# Patient Record
Sex: Male | Born: 1965 | Race: Black or African American | Hispanic: No | Marital: Single | Smoking: Never smoker
Health system: Southern US, Community
[De-identification: ages and names within clinical notes are randomized; demographics above are authoritative.]

## PROBLEM LIST (undated history)

## (undated) DIAGNOSIS — L02619 Cutaneous abscess of unspecified foot: Secondary | ICD-10-CM

---

## 2013-02-09 ENCOUNTER — Encounter (HOSPITAL_COMMUNITY): Payer: Self-pay | Admitting: Emergency Medicine

## 2013-02-09 ENCOUNTER — Emergency Department (INDEPENDENT_AMBULATORY_CARE_PROVIDER_SITE_OTHER)
Admission: EM | Admit: 2013-02-09 | Discharge: 2013-02-09 | Disposition: A | Discharge status: Nursing Home (Includes ICF) | Payer: Self-pay | Source: Home / Self Care | Attending: Family Medicine | Admitting: Family Medicine

## 2013-02-09 ENCOUNTER — Emergency Department (HOSPITAL_COMMUNITY)
Admission: EM | Admit: 2013-02-09 | Discharge: 2013-02-09 | Disposition: A | Discharge status: Home / Self Care | Payer: Self-pay | Attending: Emergency Medicine | Admitting: Emergency Medicine

## 2013-02-09 DIAGNOSIS — L0201 Cutaneous abscess of face: Secondary | ICD-10-CM

## 2013-02-09 DIAGNOSIS — L03211 Cellulitis of face: Secondary | ICD-10-CM | POA: Insufficient documentation

## 2013-02-09 MED ORDER — CEPHALEXIN 500 MG PO CAPS: 500.0000 mg | ORAL_CAPSULE | Freq: Four times a day (QID) | ORAL | Status: DC

## 2013-02-09 MED ORDER — SULFAMETHOXAZOLE-TRIMETHOPRIM 800-160 MG PO TABS: 1.0000 | ORAL_TABLET | Freq: Two times a day (BID) | ORAL | Status: DC

## 2013-02-09 NOTE — ED Notes (Signed)
Pt from [facility], c/o 2 abscesses. One to right eyebrow, one to right lower lip.

## 2013-02-09 NOTE — ED Provider Notes (Signed)
CSN: [id number]     Arrival date & time 02/09/13  1047 History  This chart was scribed for non-physician practitioner Sharilyn Sites, PA-C, working with Bonnita Levan. Bernette Mayers, MD by Dorothey Baseman, ED Scribe. This patient was seen in room TR05C/TR05C and the patient's care was started at 10:51 AM.    Chief Complaint  Patient presents with  . Abscess   The history is provided by the patient. No language interpreter was used.   HPI Comments: Devon Richardson is a 47 y.o. male who presents to the Emergency Department complaining of facial abscesses, one to the right corner of the mouth and one to the right, lateral eyebrow region onset around 1 week ago. Patient reports that he noticed 2 abscesses on his face Monday that he states appeared like pimples, but have been progressively increased in size ever since.  No active drainage.  Abscesses did not appear after shaving.  Has some intermittent itching of the area. Pt states he was on a vacation and the beach house he was staying in had spiders but he denies definitive recollection of being bitten. No new facial products, soaps, or detergents.  Patient reports applying alcohol and hot and cold compresses at home without relief. He denies fever and chills.  No visual disturbance.    No past medical history on file. No past surgical history on file. No family history on file. History  Substance Use Topics  . Smoking status: Not on file  . Smokeless tobacco: Not on file  . Alcohol Use: Not on file    Review of Systems  Constitutional: Negative for fever and chills.  Skin:       Abscess (2)  All other systems reviewed and are negative.   Allergies  Review of patient's allergies indicates no known allergies.  Home Medications  No current outpatient prescriptions on file.  Triage Vitals: BP 147/88  Pulse 105  Temp(Src) 98.3 F (36.8 C)  Resp 12  Ht 5\' 10"  (1.778 m)  Wt 170 lb (77.111 kg)  BMI 24.39 kg/m2  SpO2 100%  Physical Exam  Nursing  note and vitals reviewed. Constitutional: He is oriented to person, place, and time. He appears well-developed and well-nourished. No distress.  HENT:  Head: Normocephalic and atraumatic.    0.5cm abscess adjacent to right lower lip; no central fluctuance or active drainage; no surrounding erythema, induration, or evidence of cellulitis  Eyes: Conjunctivae and EOM are normal.    1-2cm abscess to right lateral eyebrow; no central fluctuance or drainage; localized erythema without induration or evidence of cellulitis; no visual disturbance; EOM intact and non-painful; PERRL direct and consensual  Neck: Normal range of motion.  Cardiovascular: Normal rate, regular rhythm and normal heart sounds.   Pulmonary/Chest: Effort normal and breath sounds normal. No respiratory distress.  Abdominal: Soft. He exhibits no distension.  Musculoskeletal: Normal range of motion.  Neurological: He is alert and oriented to person, place, and time.  Skin: Skin is warm and dry. He is not diaphoretic.  Psychiatric: He has a normal mood and affect.    ED Course  Procedures (including critical care time)  DIAGNOSTIC STUDIES: Oxygen Saturation is 100% on room air, normal by my interpretation.    COORDINATION OF CARE: 10:55 AM- Will consult with the attending physician and we decided not to perform an incision and drainage of the areas. Will discharge patient with Septra and Keflex and advised him to follow up in 48 hours for a wound re-check. Advised patient to  apply warm compresses to the areas. Discussed treatment plan with patient at bedside and patient verbalized agreement.     Labs Review Labs Reviewed - No data to display Imaging Review No results found.  EKG Interpretation   None       MDM   1. Facial abscess     Pt sent from [facility] for further evaluation of facial abscess and possible CT.  Evaluated by Dr. Bernette Mayers-- advised CT not indicated at this time, I&D not recommended.  Will attempt  conservative treatment with warm compresses and abx.  Wound check in 48 hours.  Discussed plan with pt, he agreed.  Return precautions advised.  I personally performed the services described in this documentation, which was scribed in my presence. The recorded information has been reviewed and is accurate.  Garlon Hatchet, PA-C 02/09/13 1150

## 2013-02-09 NOTE — ED Notes (Signed)
C/o insect bite.  Patient states he saw two pimples on his face Monday night which has grown since then.  The area has been itching.  Alcohol and hot and cold compresses was used as treatment.

## 2013-02-09 NOTE — ED Provider Notes (Signed)
CSN: [id number]     Arrival date & time 02/09/13  4098 History   First MD Initiated Contact with Patient 02/09/13 0930     Chief Complaint  Patient presents with  . Insect Bite   HPI: Patient is a 47 y.o. male presenting with abscess. The history is provided by the patient.  Abscess Pt noted what appeared to be a small pimple to his lateral (R) eyebrow Monday that has progressed in size over the last 7 days. Now w/ large, raised, approx 3cm, erythemaotous abscess. Approx a day or two later noted similar swelling to (R) lower face just to the periphery of the lip line. This area is approx 1-2  cm slightly raised and seems to track under the lip. He states these could have started as spider bites as he has noted small spiders in his home. Reports increasing pain to both. Denies fever. Pt has ayyempted to treat w/ warm compresses w/o relief.  History reviewed. No pertinent past medical history. No past surgical history on file. No family history on file. History  Substance Use Topics  . Smoking status: Not on file  . Smokeless tobacco: Not on file  . Alcohol Use: Not on file    Review of Systems  All other systems reviewed and are negative.    Allergies  Review of patient's allergies indicates no known allergies.  Home Medications  No current outpatient prescriptions on file. BP 138/83  Pulse 53  Temp(Src) 98.5 F (36.9 C) (Oral)  Resp 15  SpO2 99% Physical Exam  Constitutional: He is oriented to person, place, and time. He appears well-developed and well-nourished.  HENT:  Head: Normocephalic.    Nose: Nose normal.  Mouth/Throat: Uvula is midline, oropharynx is clear and moist and mucous membranes are normal.  Eyes: Conjunctivae and EOM are normal. Right eye exhibits no discharge. Left eye exhibits no discharge. No scleral icterus.  Cardiovascular: Normal rate.   Pulmonary/Chest: Effort normal.  Musculoskeletal: Normal range of motion.  Neurological: He is alert and  oriented to person, place, and time.  Skin: Skin is warm and dry.  Psychiatric: He has a normal mood and affect.    ED Course  Procedures (including critical care time) Labs Review Labs Reviewed - No data to display Imaging Review No results found.  EKG Interpretation     Ventricular Rate:    PR Interval:    QRS Duration:   QT Interval:    QTC Calculation:   R Axis:     Text Interpretation:              MDM  No diagnosis found.  Discussed w/ Dr Artis Flock, given potential complexity of I&D to facial abscesses and possible need for ct to better define each (particularly the abscess to the lower face area) will send to [facility] for further eval. Discussed plan with pt who is agreeable    Leanne Chang, NP 02/10/13 2125

## 2013-02-11 ENCOUNTER — Emergency Department (HOSPITAL_COMMUNITY)
Admission: EM | Admit: 2013-02-11 | Discharge: 2013-02-11 | Disposition: A | Discharge status: Home / Self Care | Payer: Self-pay | Attending: Emergency Medicine | Admitting: Emergency Medicine

## 2013-02-11 ENCOUNTER — Encounter (HOSPITAL_COMMUNITY): Payer: Self-pay | Admitting: Emergency Medicine

## 2013-02-11 DIAGNOSIS — L03211 Cellulitis of face: Secondary | ICD-10-CM | POA: Insufficient documentation

## 2013-02-11 DIAGNOSIS — L0201 Cutaneous abscess of face: Secondary | ICD-10-CM | POA: Insufficient documentation

## 2013-02-11 NOTE — ED Notes (Signed)
Pt reports he was seen here on Sunday for abscess to the right eyebrow and right corner on mouth. Reports he was placed on antibiotics, but the areas have gotten bigger. Pt reports the pain is worse and his mouth is very sore. Denies any fever at home.

## 2013-02-11 NOTE — ED Notes (Signed)
Pt screened by Ebbie Ridge, PA and ordered to move to acute side.

## 2013-02-11 NOTE — ED Provider Notes (Signed)
Medical screening examination/treatment/procedure(s) were conducted as a shared visit with non-physician practitioner(s) and myself.  I personally evaluated the patient during the encounter  Pt with folliculitis of face x 2. No abscess, no concern for deep tissue infection. Abx and strict 48hr f/u.   Charles B. Bernette Mayers, MD 02/11/13 [phone]

## 2013-02-11 NOTE — ED Provider Notes (Signed)
Medical screening examination/treatment/procedure(s) were performed by resident physician or non-physician practitioner and as supervising physician I was immediately available for consultation/collaboration.   KINDL,JAMES DOUGLAS MD.   James D Kindl, MD 02/11/13 1417 

## 2013-02-11 NOTE — ED Provider Notes (Signed)
CSN: [id number]     Arrival date & time 02/11/13  1478 History   First MD Initiated Contact with Patient 02/11/13 0902     Chief Complaint  Patient presents with  . Abscess   (Consider location/radiation/quality/duration/timing/severity/associated sxs/prior Treatment) HPI Comments: 47 year old male presents to the emergency department for worsening abscess to his right eyebrow and right lower face after being seen in the emergency department 2 days ago. Yesterday he was evaluated by urgent care and was advised to go to the emergency department for further evaluation due to the area of the abscesses are. Two days ago he was placed on Bactrim and Keflex, has been applying warm compresses, however the areas have been getting larger. Denies any vision changes, eye pain, dental pain, fever or chills. No drainage from the areas.  Patient is a 47 y.o. male presenting with abscess. The history is provided by the patient.  Abscess   History reviewed. No pertinent past medical history. History reviewed. No pertinent past surgical history. History reviewed. No pertinent family history. History  Substance Use Topics  . Smoking status: Never Smoker   . Smokeless tobacco: Not on file  . Alcohol Use: Not on file    Review of Systems  Skin:       Positive for abscess.  All other systems reviewed and are negative.    Allergies  Review of patient's allergies indicates no known allergies.  Home Medications   Current Outpatient Rx  Name  Route  Sig  Dispense  Refill  . cephALEXin (KEFLEX) 500 MG capsule   Oral   Take 1 capsule (500 mg total) by mouth 4 (four) times daily.   40 capsule   0   . sulfamethoxazole-trimethoprim (SEPTRA DS) 800-160 MG per tablet   Oral   Take 1 tablet by mouth every 12 (twelve) hours.   20 tablet   0    BP 137/90  Pulse 61  Temp(Src) 98.1 F (36.7 C) (Oral)  Resp 18  Ht 5\' 10"  (1.778 m)  Wt 172 lb (78.019 kg)  BMI 24.68 kg/m2  SpO2 100% Physical  Exam  Constitutional: He is oriented to person, place, and time. He appears well-developed and well-nourished. No distress.  HENT:  Head: Normocephalic and atraumatic.  Mouth/Throat: Oropharynx is clear and moist.  2 cm abscess with central pustule above right eyebrow, fluctuant. Tender to palpation. No drainage. No surrounding erythema or edema evidence of cellulitis. 1 cm abscess to the right of his mouth, lateral to chin with central pustule. No drainage. No surrounding erythema or edema evidence of cellulitis. Normal dentition. No abscess inside mouth. No trismus.  Eyes: Conjunctivae and EOM are normal. Pupils are equal, round, and reactive to light.  Neck: Normal range of motion. Neck supple.  Cardiovascular: Normal rate, regular rhythm and normal heart sounds.   Pulmonary/Chest: Effort normal and breath sounds normal.  Musculoskeletal: Normal range of motion. He exhibits no edema.  Neurological: He is alert and oriented to person, place, and time.  Skin: Skin is warm and dry. He is not diaphoretic.  Psychiatric: He has a normal mood and affect. His behavior is normal.    ED Course  Procedures (including critical care time) INCISION AND DRAINAGE Performed by: Johnnette Gourd Consent: Verbal consent obtained. Risks and benefits: risks, benefits and alternatives were discussed Type: abscess  Body area: above right eyebrow  Anesthesia: local infiltration  Incision was made with a scalpel.  Local anesthetic: lidocaine 2% without epinephrine  Anesthetic total: 2  ml  Complexity: complex Blunt dissection to break up loculations  Drainage: purulent  Drainage amount: large  Packing material: none  Patient tolerance: Patient tolerated the procedure well with no immediate complications.  INCISION AND DRAINAGE Performed by: Johnnette Gourd Consent: Verbal consent obtained. Risks and benefits: risks, benefits and alternatives were discussed Type: abscess  Body area:  face  Anesthesia: local infiltration  Incision was made with a scalpel.  Local anesthetic: lidocaine 2% without epinephrine  Anesthetic total: 2 ml  Complexity: simple  Drainage: purulent  Drainage amount: large  Packing material: 1/4 in iodoform gauze  Patient tolerance: Patient tolerated the procedure well with no immediate complications.        Labs Review Labs Reviewed - No data to display Imaging Review No results found.  EKG Interpretation   None       MDM   1. Abscess of face    Patient with two abscesses to his face. No eye involvement. No trismus, oral lesions. Definite focal abscesses to face. Both abscesses drained with a large amount of purulent material expressed. He is afebrile and in no apparent distress with normal vital signs. Currently taking Keflex and Bactrim, I discussed importance of continuing his antibiotics. He will followup with either the wellness clinic or urgent care in 2 days for recheck. Close return precautions discussed. Patient states understanding of treatment care plan and is agreeable. Case discussed with my attending Dr. Jeraldine Loots who also evaluated patient and agrees with plan of care.     Trevor Mace, PA-C 02/11/13 [phone]

## 2013-02-12 NOTE — ED Provider Notes (Signed)
  This was a shared visit with a mid-level provided (NP or PA).  Throughout the patient's course I was available for consultation/collaboration.  On my exam the patient stated that he was feeling substantially better.  He had already had incision and drainage of his abscesses. Patient is taking antibiotics, will return for a wound check.        Gerhard Munch, MD 02/12/13 [phone]

## 2013-03-13 ENCOUNTER — Encounter: Payer: Self-pay | Admitting: Internal Medicine

## 2013-03-13 ENCOUNTER — Ambulatory Visit: Payer: Self-pay | Attending: Internal Medicine | Admitting: Internal Medicine

## 2013-03-13 VITALS — BP 131/67 | HR 53 | Temp 98.4°F | Resp 16 | Ht 70.0 in | Wt 180.0 lb

## 2013-03-13 DIAGNOSIS — L03211 Cellulitis of face: Secondary | ICD-10-CM | POA: Insufficient documentation

## 2013-03-13 DIAGNOSIS — L0201 Cutaneous abscess of face: Secondary | ICD-10-CM | POA: Insufficient documentation

## 2013-03-13 DIAGNOSIS — Z0000 Encounter for general adult medical examination without abnormal findings: Secondary | ICD-10-CM

## 2013-03-13 NOTE — Progress Notes (Signed)
Pt here to establish care Urgent care f/u right upper lid abscess with ATB treatment. No swelling or blurry vision noted No medical hx noted

## 2013-03-13 NOTE — Progress Notes (Signed)
Patient Demographics  Devon Richardson, is a 47 y.o. male  ZOX:[id number]  [id number]:[id number]  DOB - Dec 21, 1965  Chief Complaint  Patient presents with  . Follow-up  . Establish Care        Subjective:   Devon Richardson today is here to establish primary care. Patient is a 47 year old African American male without any past medical history wheezing he had some small facial abscess that was incised and drained, he has completed a course of antibiotics. These abscesses have completely resolved. He is yet to establish care.  Currently patient has no complaints. Patient has also has No headache, No chest pain, No abdominal pain,No Nausea, No new weakness tingling or numbness, No Cough or SOB.  Objective:    Filed Vitals:   03/13/13 1429  BP: 131/67  Pulse: 53  Temp: 98.4 F (36.9 C)  TempSrc: Oral  Resp: 16  Height: 5\' 10"  (1.778 m)  Weight: 180 lb (81.647 kg)  SpO2: 99%     ALLERGIES:  No Known Allergies  PAST MEDICAL HISTORY: History reviewed. No pertinent past medical history.  PAST SURGICAL HISTORY: History reviewed. No pertinent past surgical history.  FAMILY HISTORY: History reviewed. No pertinent family history.  MEDICATIONS AT HOME: Prior to Admission medications   Medication Sig Start Date End Date Taking? Authorizing Provider  cephALEXin (KEFLEX) 500 MG capsule Take 1 capsule (500 mg total) by mouth 4 (four) times daily. 02/09/13   Garlon Hatchet, PA-C  sulfamethoxazole-trimethoprim (SEPTRA DS) 800-160 MG per tablet Take 1 tablet by mouth every 12 (twelve) hours. 02/09/13   Garlon Hatchet, PA-C    SOCIAL HISTORY:   reports that he has never smoked. He does not have any smokeless tobacco history on file. His alcohol and drug histories are not on file.  REVIEW OF SYSTEMS:  Constitutional:   No   Fevers, chills, fatigue.  HEENT:    No headaches, Sore throat,   Cardio-vascular: No chest pain,  Orthopnea, swelling in lower extremities, anasarca,  palpitations  GI:  No abdominal pain, nausea, vomiting, diarrhea  Resp: No shortness of breath,  No coughing up of blood.No cough.No wheezing.  Skin:  no rash or lesions.  GU:  no dysuria, change in color of urine, no urgency or frequency.  No flank pain.  Musculoskeletal: No joint pain or swelling.  No decreased range of motion.  No back pain.  Psych: No change in mood or affect. No depression or anxiety.  No memory loss.   Exam  General appearance :Awake, alert, not in any distress. Speech Clear. Not toxic Looking HEENT: Atraumatic and Normocephalic, pupils equally reactive to light and accomodation Neck: supple, no JVD. No cervical lymphadenopathy.  Chest:Good air entry bilaterally, no added sounds  CVS: S1 S2 regular, no murmurs.  Abdomen: Bowel sounds present, Non tender and not distended with no gaurding, rigidity or rebound. Extremities: B/L Lower Ext shows no edema, both legs are warm to touch Neurology: Awake alert, and oriented X 3, CN II-XII intact, Non focal Skin:No Rash Wounds:N/A    Data Review   CBC No results found for this basename: WBC, HGB, HCT, PLT, MCV, MCH, MCHC, RDW, NEUTRABS, LYMPHSABS, MONOABS, EOSABS, BASOSABS, BANDABS, BANDSABD,  in the last 168 hours  Chemistries   No results found for this basename: NA, K, CL, CO2, GLUCOSE, BUN, CREATININE, GFRCGP, CALCIUM, MG, AST, ALT, ALKPHOS, BILITOT,  in the last 168 hours ------------------------------------------------------------------------------------------------------------------ No results found for this basename: HGBA1C,  in the last 72 hours ------------------------------------------------------------------------------------------------------------------ No  results found for this basename: CHOL, HDL, LDLCALC, TRIG, CHOLHDL, LDLDIRECT,  in the last 72 hours ------------------------------------------------------------------------------------------------------------------ No results found for  this basename: TSH, T4TOTAL, FREET3, T3FREE, THYROIDAB,  in the last 72 hours ------------------------------------------------------------------------------------------------------------------ No results found for this basename: VITAMINB12, FOLATE, FERRITIN, TIBC, IRON, RETICCTPCT,  in the last 72 hours  Coagulation profile  No results found for this basename: INR, PROTIME,  in the last 168 hours    Assessment & Plan   Small facial abscess-resolved  Will check CBC, TSH, chemistries, lipids and A1c  Followup in 3-4 weeks to discuss above results  The patient was given clear instructions to go to ER or return to medical center if symptoms don't improve, worsen or new problems develop. The patient verbalized understanding. The patient was told to call to get lab results if they haven't heard anything in the next week.

## 2013-03-20 ENCOUNTER — Ambulatory Visit: Payer: Self-pay | Attending: Internal Medicine

## 2013-03-20 DIAGNOSIS — Z0000 Encounter for general adult medical examination without abnormal findings: Secondary | ICD-10-CM

## 2013-03-20 LAB — LIPID PANEL
HDL: 73 mg/dL (ref >39)
LDL Cholesterol: 81 mg/dL (ref 0–99)
Total CHOL/HDL Ratio: 2.2 Ratio
Triglycerides: 28 mg/dL (ref <150)
VLDL: 6 mg/dL (ref 0–40)

## 2013-03-20 LAB — HEMOGLOBIN A1C: Mean Plasma Glucose: 114 mg/dL (ref <117)

## 2013-03-20 LAB — CBC
HCT: 39.8 % (ref 39.0–52.0)
MCH: 28.7 pg (ref 26.0–34.0)
MCHC: 34.2 g/dL (ref 30.0–36.0)
Platelets: 194 10*3/uL (ref 150–400)
RDW: 13.3 % (ref 11.5–15.5)
WBC: 3.6 10*3/uL — ABNORMAL LOW (ref 4.0–10.5)

## 2013-03-31 DIAGNOSIS — L02619 Cutaneous abscess of unspecified foot: Secondary | ICD-10-CM

## 2013-03-31 HISTORY — DX: Cutaneous abscess of unspecified foot: L02.619

## 2013-04-03 ENCOUNTER — Ambulatory Visit: Payer: Self-pay

## 2013-04-04 ENCOUNTER — Emergency Department (HOSPITAL_COMMUNITY)
Admission: EM | Admit: 2013-04-04 | Discharge: 2013-04-04 | Disposition: A | Discharge status: Home / Self Care | Payer: Self-pay | Attending: Emergency Medicine | Admitting: Emergency Medicine

## 2013-04-04 ENCOUNTER — Emergency Department (HOSPITAL_COMMUNITY): Payer: Self-pay

## 2013-04-04 ENCOUNTER — Encounter (HOSPITAL_COMMUNITY): Payer: Self-pay | Admitting: Emergency Medicine

## 2013-04-04 DIAGNOSIS — Y929 Unspecified place or not applicable: Secondary | ICD-10-CM | POA: Insufficient documentation

## 2013-04-04 DIAGNOSIS — Y9389 Activity, other specified: Secondary | ICD-10-CM | POA: Insufficient documentation

## 2013-04-04 DIAGNOSIS — R21 Rash and other nonspecific skin eruption: Secondary | ICD-10-CM | POA: Insufficient documentation

## 2013-04-04 DIAGNOSIS — S8990XA Unspecified injury of unspecified lower leg, initial encounter: Secondary | ICD-10-CM | POA: Insufficient documentation

## 2013-04-04 DIAGNOSIS — IMO0002 Reserved for concepts with insufficient information to code with codable children: Secondary | ICD-10-CM | POA: Insufficient documentation

## 2013-04-04 DIAGNOSIS — M79671 Pain in right foot: Secondary | ICD-10-CM

## 2013-04-04 MED ORDER — HYDROCODONE-ACETAMINOPHEN 5-325 MG PO TABS: 1.0000 | ORAL_TABLET | Freq: Four times a day (QID) | ORAL | Status: DC | PRN

## 2013-04-04 MED ORDER — IBUPROFEN 800 MG PO TABS: 800.0000 mg | ORAL_TABLET | Freq: Three times a day (TID) | ORAL | Status: DC

## 2013-04-04 MED ORDER — HYDROCODONE-ACETAMINOPHEN 5-325 MG PO TABS
1.0000 | ORAL_TABLET | Freq: Once | ORAL | Status: AC
  Administered 2013-04-04: 1 via ORAL
  Filled 2013-04-04: qty 1

## 2013-04-04 NOTE — ED Provider Notes (Signed)
CSN: [id number]     Arrival date & time 04/04/13  1830 History   First MD Initiated Contact with Patient 04/04/13 1900     This chart was scribed for non-physician practitioner, Francee Piccolo PA-C working with Layla Maw Ward, DO by Arlan Organ, ED Scribe. This patient was seen in room TR09C/TR09C and the patient's care was started at 7:50 PM.   Chief Complaint  Patient presents with  . Foot Swelling   The history is provided by the patient. No language interpreter was used.    HPI Comments: Devon Richardson is a 47 y.o. male who presents to the Emergency Department complaining of gradual onset, unchanged, constant right foot swelling that started 2 days ago. Pt states he hit his foot on a cinder block while wearing a steel toe shoe. Pt says he noted swelling and discomfort the following morning. He describes the pain as "throbbing" and "stabbing". He states elevation worsens the pain, and standing up makes the pain better. He says he has tried Aleve and Tylenol with no relief. He denies any prior injury to the foot. He denies cough or emesis. Pt has a h/o an ongoing intermittent fungal infection to the right foot.  History reviewed. No pertinent past medical history. History reviewed. No pertinent past surgical history. History reviewed. No pertinent family history. History  Substance Use Topics  . Smoking status: Never Smoker   . Smokeless tobacco: Not on file  . Alcohol Use: Yes    Review of Systems  Respiratory: Negative for cough.   Gastrointestinal: Negative for vomiting.  Musculoskeletal: Positive for arthralgias (right ankle swelling).    Allergies  Review of patient's allergies indicates no known allergies.  Home Medications   Current Outpatient Rx  Name  Route  Sig  Dispense  Refill  . ibuprofen (ADVIL,MOTRIN) 200 MG tablet   Oral   Take 400 mg by mouth 2 (two) times daily as needed (pain).         . naproxen sodium (ANAPROX) 220 MG tablet   Oral   Take 440  mg by mouth every 4 (four) hours as needed (pain).         Marland Kitchen HYDROcodone-acetaminophen (NORCO/VICODIN) 5-325 MG per tablet   Oral   Take 1 tablet by mouth every 6 (six) hours as needed for severe pain.   8 tablet   0   . ibuprofen (ADVIL,MOTRIN) 800 MG tablet   Oral   Take 1 tablet (800 mg total) by mouth 3 (three) times daily.   21 tablet   0    Triage Vitals: BP 144/78  Pulse 70  Temp(Src) 98.2 F (36.8 C) (Oral)  Ht 5\' 11"  (1.803 m)  Wt 185 lb 9.6 oz (84.188 kg)  BMI 25.90 kg/m2  SpO2 98%  Physical Exam  Nursing note and vitals reviewed. Constitutional: He is oriented to person, place, and time. He appears well-developed and well-nourished. No distress.  HENT:  Head: Normocephalic and atraumatic.  Right Ear: External ear normal.  Left Ear: External ear normal.  Nose: Nose normal.  Eyes: Conjunctivae and EOM are normal.  Neck: Normal range of motion.  Cardiovascular: Normal rate and intact distal pulses.   Cap refill < 3 sec  Pulmonary/Chest: Effort normal.  Musculoskeletal: Normal range of motion. He exhibits tenderness.       Right ankle: Normal.       Left ankle: Normal.       Right foot: He exhibits tenderness and swelling. He exhibits no bony  tenderness, normal capillary refill, no crepitus, no deformity and no laceration.       Left foot: Normal.  Neurological: He is alert and oriented to person, place, and time.  Skin: Skin is warm and dry. Rash (Scaly rash to right and left foot) noted. He is not diaphoretic.  Psychiatric: He has a normal mood and affect. His behavior is normal.    ED Course  Procedures (including critical care time)  DIAGNOSTIC STUDIES: Oxygen Saturation is 98% on RA, Normal by my interpretation.    COORDINATION OF CARE: 7:53 PM- Will order X-Ray. Will give pain medication. Discussed treatment plan with pt at bedside and pt agreed to plan.     Labs Review Labs Reviewed - No data to display Imaging Review Dg Foot Complete  Right  04/04/2013   CLINICAL DATA:  Right foot pain in the region of the 2nd MTP joint following an injury.  EXAM: RIGHT FOOT COMPLETE - 3+ VIEW  COMPARISON:  None.  FINDINGS: Distal soft tissue swelling. No fracture or dislocation. Accessory ossification centers adjacent to the navicular.  IMPRESSION: No fracture.   Electronically Signed   By: Gordan Payment M.D.   On: 04/04/2013 19:13    EKG Interpretation   None       MDM   1. Right foot pain     Afebrile, NAD, non-toxic appearing, AAOx4. Neurovascularly intact. Normal sensation. Pt w/ non acute tinea pedis, uses Lotrimin at home intermittently with success. Patient X-Ray negative for obvious fracture or dislocation. Pain managed in ED. Pt advised to follow up with orthopedics if symptoms persist for possibility of missed fracture diagnosis. Patient given brace while in ED, conservative therapy recommended and discussed. Patient will be dc home & is agreeable with above plan. Return precautions discussed. Patient is agreeable to plan. Patient is stable at time of discharge.     I personally performed the services described in this documentation, which was scribed in my presence. The recorded information has been reviewed and is accurate.    Jeannetta Ellis, PA-C 04/04/13 2216

## 2013-04-04 NOTE — Progress Notes (Signed)
Orthopedic Tech Progress Note Patient Details:  Devon Richardson 11-Mar-1966 [id number]  Ortho Devices Type of Ortho Device: Postop shoe/boot Ortho Device/Splint Location: RLE Ortho Device/Splint Interventions: Ordered;Application   Jennye Moccasin 04/04/2013, 8:08 PM

## 2013-04-04 NOTE — ED Provider Notes (Signed)
Medical screening examination/treatment/procedure(s) were performed by non-physician practitioner and as supervising physician I was immediately available for consultation/collaboration.  EKG Interpretation   None         Vanity Larsson N Esmerelda Finnigan, DO 04/04/13 2357 

## 2013-04-04 NOTE — ED Notes (Signed)
Pt states he hit his foot on Saturday on a cinderblock while wearing a steel toe shoe. The next morning his foot started swelling was painful. Foot appears warm and swollen. 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 Strong pedal pulses present in foot.

## 2013-04-06 ENCOUNTER — Encounter (HOSPITAL_COMMUNITY): Payer: Self-pay | Admitting: Emergency Medicine

## 2013-04-06 ENCOUNTER — Emergency Department (HOSPITAL_COMMUNITY)
Admission: EM | Admit: 2013-04-06 | Discharge: 2013-04-07 | Disposition: A | Discharge status: Home / Self Care | Payer: Self-pay | Attending: Emergency Medicine | Admitting: Emergency Medicine

## 2013-04-06 DIAGNOSIS — X58XXXA Exposure to other specified factors, initial encounter: Secondary | ICD-10-CM | POA: Insufficient documentation

## 2013-04-06 DIAGNOSIS — Y939 Activity, unspecified: Secondary | ICD-10-CM | POA: Insufficient documentation

## 2013-04-06 DIAGNOSIS — L02619 Cutaneous abscess of unspecified foot: Secondary | ICD-10-CM | POA: Insufficient documentation

## 2013-04-06 DIAGNOSIS — L089 Local infection of the skin and subcutaneous tissue, unspecified: Secondary | ICD-10-CM

## 2013-04-06 DIAGNOSIS — Y929 Unspecified place or not applicable: Secondary | ICD-10-CM | POA: Insufficient documentation

## 2013-04-06 LAB — CBC WITH DIFFERENTIAL/PLATELET
Basophils Absolute: 0 10*3/uL (ref 0.0–0.1)
Basophils Relative: 0 % (ref 0–1)
Eosinophils Absolute: 0.2 10*3/uL (ref 0.0–0.7)
Eosinophils Relative: 1 % (ref 0–5)
HCT: 39.2 % (ref 39.0–52.0)
MCH: 30.1 pg (ref 26.0–34.0)
MCHC: 34.4 g/dL (ref 30.0–36.0)
MCV: 87.5 fL (ref 78.0–100.0)
Monocytes Absolute: 0.8 10*3/uL (ref 0.1–1.0)
Monocytes Relative: 8 % (ref 3–12)
Neutro Abs: 8.3 10*3/uL — ABNORMAL HIGH (ref 1.7–7.7)
Platelets: 252 10*3/uL (ref 150–400)
RDW: 13.2 % (ref 11.5–15.5)
WBC: 11.1 10*3/uL — ABNORMAL HIGH (ref 4.0–10.5)

## 2013-04-06 NOTE — ED Notes (Signed)
Pt presents here with swollen right foot, draining yellow fluid between two toes. Denies N/V/D. Was seen here Friday for foot pain but foot has progressively worsened.

## 2013-04-06 NOTE — ED Provider Notes (Signed)
MSE was initiated and I personally evaluated the patient and placed orders  at  11:27 PM on April 06, 2013.   Devon Richardson is a 47 y.o. male who presents to the Emergency Department complaining of constant, worsening foot pain and swelling that began a week ago. He states he hit his foot on a cinder block  but the pain and swelling began a few days later. He was seen on 12/5 in the ED for the same pain but was discharged with pain medication after the x-ray came back normal. He returned to the ED because the area has started draining, his foot is significant more swollen. He states the area has been infected in the past a few years ago.  PE: The foot is significantly swollen. He has sausage digits with a significant amount of purulent bloody discharge from the toes. He is in significant pain and is unable to walk on it.  Labs: Cbc and bmp ordered.   The patient appears stable so that the remainder of the MSE may be completed by another provider.   Dorthula Matas, PA-C 04/06/13 2329  Dorthula Matas, PA-C 04/06/13 2330

## 2013-04-07 ENCOUNTER — Emergency Department (HOSPITAL_COMMUNITY): Payer: Self-pay

## 2013-04-07 LAB — BASIC METABOLIC PANEL
BUN: 14 mg/dL (ref 6–23)
Calcium: 9.5 mg/dL (ref 8.4–10.5)
Chloride: 100 mEq/L (ref 96–112)
Creatinine, Ser: 1.03 mg/dL (ref 0.50–1.35)
GFR calc Af Amer: >90 mL/min (ref >90)
GFR calc non Af Amer: 85 mL/min — ABNORMAL LOW (ref >90)
Sodium: 136 mEq/L (ref 135–145)

## 2013-04-07 LAB — SEDIMENTATION RATE: Sed Rate: 31 mm/hr — ABNORMAL HIGH (ref 0–16)

## 2013-04-07 MED ORDER — OXYCODONE-ACETAMINOPHEN 5-325 MG PO TABS: 1.0000 | ORAL_TABLET | ORAL | Status: DC | PRN

## 2013-04-07 MED ORDER — CIPROFLOXACIN HCL 500 MG PO TABS: 500.0000 mg | ORAL_TABLET | Freq: Two times a day (BID) | ORAL | Status: DC

## 2013-04-07 MED ORDER — CLINDAMYCIN PHOSPHATE 600 MG/50ML IV SOLN
600.0000 mg | Freq: Once | INTRAVENOUS | Status: AC
  Administered 2013-04-07: 600 mg via INTRAVENOUS
  Filled 2013-04-07: qty 50

## 2013-04-07 NOTE — ED Notes (Signed)
Dr molpus at bedside  

## 2013-04-07 NOTE — ED Provider Notes (Signed)
Medical screening examination/treatment/procedure(s) were conducted as a shared visit with non-physician practitioner(s) and myself.  I personally evaluated the patient during the encounter.  EKG Interpretation   None      Swollen, tender R 2nd toe with purulent drainage.   Hanley Seamen, MD 04/07/13 1905

## 2013-04-07 NOTE — ED Notes (Signed)
Drainage cultures done prior to abx administration

## 2013-04-07 NOTE — ED Provider Notes (Signed)
CSN: [id number]     Arrival date & time 04/06/13  2243 History   First MD Initiated Contact with Patient 04/06/13 2307     Chief Complaint  Patient presents with  . Foot Pain   (Consider location/radiation/quality/duration/timing/severity/associated sxs/prior Treatment) HPI History provided by pt and prior chart.  Per prior chart, pt presented to ED on 04/04/13 w/ c/o R foot pain, s/p mechanical injury that same day.  Xray negative for fx/dislocation at that time and d/c'd home w/ post-op shoe and vicodin.  Reports that edema and pain have worsened despite elevation, icing and NSAID.  There is now purulent drainage from second toe.  No associated f/c, fatigue, generalized weakness, N/V/D, paresthesias.  No pertinent PMH. History reviewed. No pertinent past medical history. History reviewed. No pertinent past surgical history. No family history on file. History  Substance Use Topics  . Smoking status: Never Smoker   . Smokeless tobacco: Not on file  . Alcohol Use: Yes    Review of Systems  All other systems reviewed and are negative.    Allergies  Review of patient's allergies indicates no known allergies.  Home Medications   Current Outpatient Rx  Name  Route  Sig  Dispense  Refill  . HYDROcodone-acetaminophen (NORCO/VICODIN) 5-325 MG per tablet   Oral   Take 1 tablet by mouth every 6 (six) hours as needed for severe pain.   8 tablet   0   . ibuprofen (ADVIL,MOTRIN) 800 MG tablet   Oral   Take 1 tablet (800 mg total) by mouth 3 (three) times daily.   21 tablet   0   . naproxen sodium (ANAPROX) 220 MG tablet   Oral   Take 440 mg by mouth every 4 (four) hours as needed (pain).          BP 146/85  Pulse 78  Temp(Src) 98.1 F (36.7 C) (Oral)  Resp 18  SpO2 100% Physical Exam  Nursing note and vitals reviewed. Constitutional: He is oriented to person, place, and time. He appears well-developed and well-nourished. No distress.  HENT:  Head: Normocephalic and  atraumatic.  Eyes:  Normal appearance  Neck: Normal range of motion.  Pulmonary/Chest: Effort normal.  Musculoskeletal: Normal range of motion.  Right foot diffuse edematous.  Mild, poorly defined erythema of dorsal surface of forefoot. Macerated skin between all toes.  Drainage of purulent fluid from lateral aspect of distal phalanx of second toe.  Entire toe ttp. Distal sensation intact.  2+ DP pulse.    Neurological: He is alert and oriented to person, place, and time.  Psychiatric: He has a normal mood and affect. His behavior is normal.    ED Course  Procedures (including critical care time) Labs Review Labs Reviewed  CBC WITH DIFFERENTIAL - Abnormal; Notable for the following:    WBC 11.1 (*)    Neutro Abs 8.3 (*)    All other components within normal limits  BASIC METABOLIC PANEL   Imaging Review Dg Foot Complete Right  04/07/2013   CLINICAL DATA:  Injury to right second toe, with worsening swelling and laceration. Soft tissue infection; concern for osteomyelitis.  EXAM: RIGHT FOOT COMPLETE - 3+ VIEW  COMPARISON:  Right foot radiographs performed 04/04/2013  FINDINGS: There is significantly increased soft tissue swelling about the proximal second toe. There is no evidence of osseous erosion at this time to suggest osteomyelitis. There is no evidence of fracture or dislocation. Visualized joint spaces are grossly preserved.  Dorsal soft tissue swelling  is also noted at the forefoot. No radiopaque foreign bodies are seen.  IMPRESSION: No evidence of osseous erosion to suggest osteomyelitis. Significantly increased soft tissue swelling noted about the proximal second toe.   Electronically Signed   By: Roanna Raider M.D.   On: 04/07/2013 01:02    EKG Interpretation   None       MDM   1. Foot infection    Healthy 47yo M seen in ED for R foot injury 2 days ago, xray obtained and was neg for fx/dislocation.  Returns for gradually worsening pain/edema as well as new onset drainage  from L toe.  No systemic sx.  On exam, afebrile, NAD, diffusely edematous R foot, tenderness and drainage of purulent fluid from 2nd toe, no NV deficits.  Repeat xray ordered to look for evidence of Osteomyelitis.  Labs pending as well.  Pt to receive 600mg  IV clindamycin.  12:09 AM   Sed rate mildly elevated at 31.  No leukocytosis.  Xray negative for Osteomyelitis.  Case discussed w/ Dr. Luiz Blare who recommends discharge w/ cipro and f/u with him in office Tuesday.  Patient understands the importance of compliance with abx as well as f/u.  Return precautions discussed.     Otilio Miu, PA-C 04/07/13 1433

## 2013-04-08 ENCOUNTER — Other Ambulatory Visit (HOSPITAL_COMMUNITY): Payer: Self-pay | Admitting: Orthopaedic Surgery

## 2013-04-08 ENCOUNTER — Encounter (HOSPITAL_COMMUNITY): Payer: Self-pay | Admitting: *Deleted

## 2013-04-08 ENCOUNTER — Encounter (HOSPITAL_COMMUNITY): Payer: Self-pay | Admitting: Anesthesiology

## 2013-04-08 ENCOUNTER — Inpatient Hospital Stay (HOSPITAL_COMMUNITY): Payer: Self-pay | Admitting: Anesthesiology

## 2013-04-08 ENCOUNTER — Encounter (HOSPITAL_COMMUNITY)
Admission: AD | Disposition: A | Discharge status: Home / Self Care | Payer: Self-pay | Source: Ambulatory Visit | Attending: Orthopaedic Surgery

## 2013-04-08 ENCOUNTER — Inpatient Hospital Stay (HOSPITAL_COMMUNITY)
Admission: AD | Admit: 2013-04-08 | Discharge: 2013-04-11 | DRG: 572 | Disposition: A | Discharge status: Home Health | Payer: Self-pay | Source: Ambulatory Visit | Attending: Orthopaedic Surgery | Admitting: Orthopaedic Surgery

## 2013-04-08 DIAGNOSIS — L03039 Cellulitis of unspecified toe: Principal | ICD-10-CM | POA: Diagnosis present

## 2013-04-08 DIAGNOSIS — L089 Local infection of the skin and subcutaneous tissue, unspecified: Secondary | ICD-10-CM

## 2013-04-08 DIAGNOSIS — L02619 Cutaneous abscess of unspecified foot: Principal | ICD-10-CM | POA: Diagnosis present

## 2013-04-08 HISTORY — PX: DEBRIDEMENT TOE: SUR397

## 2013-04-08 HISTORY — DX: Cutaneous abscess of unspecified foot: L02.619

## 2013-04-08 HISTORY — PX: I & D EXTREMITY: SHX5045

## 2013-04-08 LAB — CBC WITH DIFFERENTIAL/PLATELET
Basophils Relative: 0 % (ref 0–1)
HCT: 41.5 % (ref 39.0–52.0)
Hemoglobin: 14.1 g/dL (ref 13.0–17.0)
Lymphocytes Relative: 15 % (ref 12–46)
MCHC: 34 g/dL (ref 30.0–36.0)
Monocytes Absolute: 0.6 10*3/uL (ref 0.1–1.0)
Monocytes Relative: 7 % (ref 3–12)
Neutro Abs: 7 10*3/uL (ref 1.7–7.7)
Neutrophils Relative %: 77 % (ref 43–77)
RBC: 4.77 MIL/uL (ref 4.22–5.81)
WBC: 9.1 10*3/uL (ref 4.0–10.5)

## 2013-04-08 LAB — BASIC METABOLIC PANEL
BUN: 13 mg/dL (ref 6–23)
Calcium: 9.3 mg/dL (ref 8.4–10.5)
Chloride: 97 mEq/L (ref 96–112)
GFR calc Af Amer: >90 mL/min (ref >90)
GFR calc non Af Amer: 79 mL/min — ABNORMAL LOW (ref >90)
Potassium: 3.8 mEq/L (ref 3.5–5.1)
Sodium: 135 mEq/L (ref 135–145)

## 2013-04-08 LAB — C-REACTIVE PROTEIN: CRP: 5.4 mg/dL — ABNORMAL HIGH (ref <0.60)

## 2013-04-08 SURGERY — IRRIGATION AND DEBRIDEMENT EXTREMITY
Anesthesia: General | Site: Toe | Laterality: Right

## 2013-04-08 MED ORDER — MAGNESIUM CITRATE PO SOLN: 1.0000 | Freq: Once | ORAL | Status: AC | PRN

## 2013-04-08 MED ORDER — ONDANSETRON HCL 4 MG/2ML IJ SOLN
INTRAMUSCULAR | Status: DC | PRN
  Administered 2013-04-08: 4 mg via INTRAVENOUS

## 2013-04-08 MED ORDER — LACTATED RINGERS IV SOLN
INTRAVENOUS | Status: DC
  Administered 2013-04-08: 11:00:00 via INTRAVENOUS

## 2013-04-08 MED ORDER — HYDROMORPHONE HCL PF 1 MG/ML IJ SOLN: 0.2500 mg | INTRAMUSCULAR | Status: DC | PRN

## 2013-04-08 MED ORDER — VANCOMYCIN HCL 1000 MG IV SOLR
1000.0000 mg | Freq: Two times a day (BID) | INTRAVENOUS | Status: DC
  Administered 2013-04-08 (×2): 1000 mg via INTRAVENOUS
  Filled 2013-04-08 (×3): qty 1000

## 2013-04-08 MED ORDER — DIPHENHYDRAMINE HCL 50 MG/ML IJ SOLN
INTRAMUSCULAR | Status: DC | PRN
  Administered 2013-04-08: 12.5 mg via INTRAVENOUS

## 2013-04-08 MED ORDER — OXYCODONE HCL 5 MG/5ML PO SOLN: 5.0000 mg | Freq: Once | ORAL | Status: DC | PRN

## 2013-04-08 MED ORDER — LIDOCAINE HCL (CARDIAC) 10 MG/ML IV SOLN
INTRAVENOUS | Status: DC | PRN
  Administered 2013-04-08: 100 mg via INTRAVENOUS

## 2013-04-08 MED ORDER — MIDAZOLAM HCL 5 MG/5ML IJ SOLN
INTRAMUSCULAR | Status: DC | PRN
  Administered 2013-04-08: 2 mg via INTRAVENOUS

## 2013-04-08 MED ORDER — ONDANSETRON HCL 4 MG/2ML IJ SOLN: 4.0000 mg | Freq: Once | INTRAMUSCULAR | Status: DC | PRN

## 2013-04-08 MED ORDER — SODIUM CHLORIDE 0.9 % IR SOLN
Status: DC | PRN
  Administered 2013-04-08: 1000 mL

## 2013-04-08 MED ORDER — ONDANSETRON HCL 4 MG PO TABS: 4.0000 mg | ORAL_TABLET | Freq: Four times a day (QID) | ORAL | Status: DC | PRN

## 2013-04-08 MED ORDER — METOCLOPRAMIDE HCL 5 MG/ML IJ SOLN: 5.0000 mg | Freq: Three times a day (TID) | INTRAMUSCULAR | Status: DC | PRN

## 2013-04-08 MED ORDER — HYDROCODONE-ACETAMINOPHEN 5-325 MG PO TABS
1.0000 | ORAL_TABLET | ORAL | Status: DC | PRN
  Administered 2013-04-10: 2 via ORAL
  Filled 2013-04-08: qty 2

## 2013-04-08 MED ORDER — POLYETHYLENE GLYCOL 3350 17 G PO PACK: 17.0000 g | PACK | Freq: Every day | ORAL | Status: DC | PRN

## 2013-04-08 MED ORDER — SENNA 8.6 MG PO TABS
1.0000 | ORAL_TABLET | Freq: Two times a day (BID) | ORAL | Status: DC
  Administered 2013-04-08 – 2013-04-10 (×6): 8.6 mg via ORAL
  Filled 2013-04-08 (×8): qty 1

## 2013-04-08 MED ORDER — VANCOMYCIN HCL IN DEXTROSE 1-5 GM/200ML-% IV SOLN
INTRAVENOUS | Status: AC
  Filled 2013-04-08: qty 200

## 2013-04-08 MED ORDER — OXYCODONE HCL 5 MG PO TABS: 5.0000 mg | ORAL_TABLET | Freq: Once | ORAL | Status: DC | PRN

## 2013-04-08 MED ORDER — DIPHENHYDRAMINE HCL 12.5 MG/5ML PO ELIX: 25.0000 mg | ORAL_SOLUTION | ORAL | Status: DC | PRN

## 2013-04-08 MED ORDER — SODIUM CHLORIDE 0.9 % IV SOLN
INTRAVENOUS | Status: DC
  Administered 2013-04-09: 1000 mL via INTRAVENOUS

## 2013-04-08 MED ORDER — ONDANSETRON HCL 4 MG/2ML IJ SOLN: 4.0000 mg | Freq: Four times a day (QID) | INTRAMUSCULAR | Status: DC | PRN

## 2013-04-08 MED ORDER — METHOCARBAMOL 500 MG PO TABS: 500.0000 mg | ORAL_TABLET | Freq: Four times a day (QID) | ORAL | Status: DC | PRN

## 2013-04-08 MED ORDER — OXYCODONE HCL 5 MG PO TABS
5.0000 mg | ORAL_TABLET | ORAL | Status: DC | PRN
  Administered 2013-04-09 – 2013-04-10 (×8): 10 mg via ORAL
  Filled 2013-04-08 (×8): qty 2

## 2013-04-08 MED ORDER — LACTATED RINGERS IV SOLN
INTRAVENOUS | Status: DC | PRN
  Administered 2013-04-08: 11:00:00 via INTRAVENOUS

## 2013-04-08 MED ORDER — SORBITOL 70 % SOLN: 30.0000 mL | Freq: Every day | Status: DC | PRN

## 2013-04-08 MED ORDER — MORPHINE SULFATE 2 MG/ML IJ SOLN: 1.0000 mg | INTRAMUSCULAR | Status: DC | PRN

## 2013-04-08 MED ORDER — METOCLOPRAMIDE HCL 5 MG PO TABS
5.0000 mg | ORAL_TABLET | Freq: Three times a day (TID) | ORAL | Status: DC | PRN
  Filled 2013-04-08: qty 2

## 2013-04-08 MED ORDER — PROPOFOL 10 MG/ML IV BOLUS
INTRAVENOUS | Status: DC | PRN
  Administered 2013-04-08: 200 mg via INTRAVENOUS

## 2013-04-08 MED ORDER — METHOCARBAMOL 100 MG/ML IJ SOLN
500.0000 mg | Freq: Four times a day (QID) | INTRAVENOUS | Status: DC | PRN
  Filled 2013-04-08: qty 5

## 2013-04-08 MED ORDER — FENTANYL CITRATE 0.05 MG/ML IJ SOLN
INTRAMUSCULAR | Status: DC | PRN
  Administered 2013-04-08: 50 ug via INTRAVENOUS
  Administered 2013-04-08 (×2): 100 ug via INTRAVENOUS

## 2013-04-08 SURGICAL SUPPLY — 61 items
BANDAGE CONFORM 3  STR LF (GAUZE/BANDAGES/DRESSINGS) IMPLANT
BANDAGE ELASTIC 3 VELCRO ST LF (GAUZE/BANDAGES/DRESSINGS) IMPLANT
BANDAGE GAUZE ELAST BULKY 4 IN (GAUZE/BANDAGES/DRESSINGS) ×2 IMPLANT
BLADE SURG 10 STRL SS (BLADE) ×2 IMPLANT
BNDG COHESIVE 1X5 TAN STRL LF (GAUZE/BANDAGES/DRESSINGS) IMPLANT
BNDG COHESIVE 4X5 TAN STRL (GAUZE/BANDAGES/DRESSINGS) ×2 IMPLANT
BNDG COHESIVE 6X5 TAN STRL LF (GAUZE/BANDAGES/DRESSINGS) ×4 IMPLANT
BNDG GAUZE STRTCH 6 (GAUZE/BANDAGES/DRESSINGS) ×6 IMPLANT
CLOTH BEACON ORANGE TIMEOUT ST (SAFETY) ×2 IMPLANT
CORDS BIPOLAR (ELECTRODE) IMPLANT
COVER SURGICAL LIGHT HANDLE (MISCELLANEOUS) ×2 IMPLANT
CUFF TOURNIQUET SINGLE 18IN (TOURNIQUET CUFF) ×2 IMPLANT
CUFF TOURNIQUET SINGLE 24IN (TOURNIQUET CUFF) IMPLANT
CUFF TOURNIQUET SINGLE 34IN LL (TOURNIQUET CUFF) ×2 IMPLANT
CUFF TOURNIQUET SINGLE 44IN (TOURNIQUET CUFF) IMPLANT
DRAPE ORTHO SPLIT 77X108 STRL (DRAPES) ×2
DRAPE SURG 17X23 STRL (DRAPES) IMPLANT
DRAPE SURG ORHT 6 SPLT 77X108 (DRAPES) ×2 IMPLANT
DRAPE U-SHAPE 47X51 STRL (DRAPES) ×2 IMPLANT
DURAPREP 26ML APPLICATOR (WOUND CARE) IMPLANT
ELECT CAUTERY BLADE 6.4 (BLADE) IMPLANT
ELECT REM PT RETURN 9FT ADLT (ELECTROSURGICAL)
ELECTRODE REM PT RTRN 9FT ADLT (ELECTROSURGICAL) IMPLANT
GAUZE PACKING IODOFORM 1/4X5 (PACKING) ×2 IMPLANT
GAUZE XEROFORM 1X8 LF (GAUZE/BANDAGES/DRESSINGS) ×2 IMPLANT
GLOVE BIOGEL PI IND STRL 6 (GLOVE) ×2 IMPLANT
GLOVE BIOGEL PI IND STRL 7.0 (GLOVE) ×2 IMPLANT
GLOVE BIOGEL PI INDICATOR 6 (GLOVE) ×2
GLOVE BIOGEL PI INDICATOR 7.0 (GLOVE) ×2
GLOVE SURG SS PI 7.5 STRL IVOR (GLOVE) ×4 IMPLANT
GOWN STRL NON-REIN LRG LVL3 (GOWN DISPOSABLE) ×4 IMPLANT
GOWN STRL REIN XL XLG (GOWN DISPOSABLE) ×2 IMPLANT
HANDPIECE INTERPULSE COAX TIP (DISPOSABLE)
IV NS IRRIG 3000ML ARTHROMATIC (IV SOLUTION) ×4 IMPLANT
KIT BASIN OR (CUSTOM PROCEDURE TRAY) ×2 IMPLANT
KIT ROOM TURNOVER OR (KITS) ×2 IMPLANT
MANIFOLD NEPTUNE II (INSTRUMENTS) ×2 IMPLANT
NS IRRIG 1000ML POUR BTL (IV SOLUTION) ×2 IMPLANT
PACK ORTHO EXTREMITY (CUSTOM PROCEDURE TRAY) ×2 IMPLANT
PAD ARMBOARD 7.5X6 YLW CONV (MISCELLANEOUS) ×4 IMPLANT
PADDING CAST ABS 4INX4YD NS (CAST SUPPLIES) ×2
PADDING CAST ABS COTTON 4X4 ST (CAST SUPPLIES) ×2 IMPLANT
PADDING CAST COTTON 6X4 STRL (CAST SUPPLIES) ×2 IMPLANT
SET HNDPC FAN SPRY TIP SCT (DISPOSABLE) IMPLANT
SET IRRIG Y TYPE TUR BLADDER L (SET/KITS/TRAYS/PACK) ×2 IMPLANT
SPONGE GAUZE 4X4 12PLY (GAUZE/BANDAGES/DRESSINGS) ×2 IMPLANT
SPONGE LAP 18X18 X RAY DECT (DISPOSABLE) ×2 IMPLANT
STOCKINETTE IMPERVIOUS 9X36 MD (GAUZE/BANDAGES/DRESSINGS) ×2 IMPLANT
SUT ETHILON 2 0 FS 18 (SUTURE) ×6 IMPLANT
SUT ETHILON 3 0 PS 1 (SUTURE) ×4 IMPLANT
SWAB COLLECTION DEVICE MRSA (MISCELLANEOUS) ×2 IMPLANT
SYR CONTROL 10ML LL (SYRINGE) IMPLANT
TOWEL OR 17X24 6PK STRL BLUE (TOWEL DISPOSABLE) ×2 IMPLANT
TOWEL OR 17X26 10 PK STRL BLUE (TOWEL DISPOSABLE) ×2 IMPLANT
TUBE ACD SOLUTION B YELLOW (MISCELLANEOUS) IMPLANT
TUBE ANAEROBIC SPECIMEN COL (MISCELLANEOUS) ×2 IMPLANT
TUBE CONNECTING 12X1/4 (SUCTIONS) ×4 IMPLANT
TUBE FEEDING 5FR 15 INCH (TUBING) IMPLANT
UNDERPAD 30X30 INCONTINENT (UNDERPADS AND DIAPERS) ×2 IMPLANT
WATER STERILE IRR 1000ML POUR (IV SOLUTION) ×2 IMPLANT
YANKAUER SUCT BULB TIP NO VENT (SUCTIONS) ×2 IMPLANT

## 2013-04-08 NOTE — Preoperative (Signed)
Beta Blockers   Reason not to administer Beta Blockers:Not Applicable 

## 2013-04-08 NOTE — Anesthesia Postprocedure Evaluation (Signed)
  Anesthesia Post-op Note  Patient: Devon Richardson  Procedure(s) Performed: Procedure(s): IRRIGATION AND DEBRIDEMENT RIGHT 2ND TOE (Right)  Patient Location: PACU  Anesthesia Type:General  Level of Consciousness: awake, alert  and oriented  Airway and Oxygen Therapy: Patient Spontanous Breathing  Post-op Pain: mild  Post-op Assessment: Post-op Vital signs reviewed, Patient's Cardiovascular Status Stable, Respiratory Function Stable, Patent Airway and Pain level controlled  Post-op Vital Signs: Reviewed and stable  Complications: No apparent anesthesia complications

## 2013-04-08 NOTE — Evaluation (Signed)
Physical Therapy Evaluation Patient Details Name: Devon Richardson MRN: [id number] DOB: Dec 09, 1965 Today's Date: 04/08/2013 Time: 4098-1191 PT Time Calculation (min): 25 min  PT Assessment / Plan / Recommendation History of Present Illness  Pt is a 47 y/o male admitted for right I&D of 2nd toe after sustaining a laceration to this toe via a cinderblock at work and infection that was not responding to antibiotics given in the ED.   Clinical Impression  This patient presents with acute pain and decreased functional independence following the above mentioned procedure. At the time of PT eval, pt required no physical assist to perform functional mobility, but close supervision and VC's to maintain WB precautions during ambulation. This patient is appropriate for skilled PT interventions in the acute care setting to address functional limitations, improve safety and independence with functional mobility, and return to PLOF. Do not anticipate the need for PT follow up at d/c.     PT Assessment  Patient needs continued PT services    Follow Up Recommendations  No PT follow up    Does the patient have the potential to tolerate intense rehabilitation      Barriers to Discharge Inaccessible home environment Flight of steps to enter apt    Equipment Recommendations  None recommended by PT    Recommendations for Other Services     Frequency Min 3X/week    Precautions / Restrictions Precautions Precautions: Fall Restrictions Weight Bearing Restrictions: Yes RLE Weight Bearing: Weight bearing as tolerated Other Position/Activity Restrictions: WB only through heel   Pertinent Vitals/Pain Pt reports no pain throughout session, but some discomfort when RLE in the dependent position.       Mobility  Bed Mobility Bed Mobility: Supine to Sit;Sitting - Scoot to Edge of Bed Supine to Sit: 7: Independent;HOB flat Sitting - Scoot to Edge of Bed: 7: Independent Details for Bed Mobility  Assistance: No assist required. Pt able to transition to EOB without use of bed rails. Transfers Transfers: Sit to Stand;Stand to Sit Sit to Stand: 6: Modified independent (Device/Increase time);From bed;With upper extremity assist Stand to Sit: 6: Modified independent (Device/Increase time);To chair/3-in-1;With upper extremity assist Details for Transfer Assistance: Pt pushing up from crutches in one hand and off seated surface with the other hand. No physical assist needed. Pt able to maintain WBAT through heel only. Ambulation/Gait Ambulation/Gait Assistance: 5: Supervision Ambulation Distance (Feet): 75 Feet Assistive device: Crutches Ambulation/Gait Assistance Details: Pt cued for WBAT status through heel only. Cautious step-through gait pattern with crutches.  Gait Pattern: Step-through pattern;Decreased stride length Gait velocity: Decreased Stairs: No    Exercises     PT Diagnosis: Difficulty walking  PT Problem List: Decreased strength;Decreased range of motion;Decreased activity tolerance;Decreased balance;Decreased mobility;Decreased knowledge of use of DME;Decreased safety awareness PT Treatment Interventions: DME instruction;Gait training;Stair training;Functional mobility training;Therapeutic activities;Therapeutic exercise;Neuromuscular re-education;Patient/family education     PT Goals(Current goals can be found in the care plan section) Acute Rehab PT Goals Patient Stated Goal: To return to work PT Goal Formulation: With patient Time For Goal Achievement: 04/15/13 Potential to Achieve Goals: Good  Visit Information  Last PT Received On: 04/08/13 Assistance Needed: +1 History of Present Illness: Pt is a 47 y/o male admitted for right I&D of 2nd toe after sustaining a laceration to this toe via a cinderblock at work and infection that was not responding to antibiotics given in the ED.        Prior Functioning  Home Living Family/patient expects to be discharged  to:: Private residence Available Help at Discharge: Friend(s);Available PRN/intermittently Type of Home: Apartment Home Access: Stairs to enter Entrance Stairs-Number of Steps: 15 Entrance Stairs-Rails: Right;Left (Can not reach both at the same time) Home Layout: One level Home Equipment: Crutches Prior Function Level of Independence: Independent Communication Communication: No difficulties Dominant Hand: Right    Cognition  Cognition Arousal/Alertness: Awake/alert Behavior During Therapy: WFL for tasks assessed/performed Overall Cognitive Status: Within Functional Limits for tasks assessed    Extremity/Trunk Assessment Upper Extremity Assessment Upper Extremity Assessment: Overall WFL for tasks assessed Lower Extremity Assessment Lower Extremity Assessment: Overall WFL for tasks assessed;RLE deficits/detail RLE Deficits / Details: Decreased strength and AROM of ankle consistent with toe infection Cervical / Trunk Assessment Cervical / Trunk Assessment: Normal   Balance Balance Balance Assessed: Yes Static Sitting Balance Static Sitting - Balance Support: Feet supported;No upper extremity supported Static Sitting - Level of Assistance: 6: Modified independent (Device/Increase time) Static Standing Balance Static Standing - Balance Support: Right upper extremity supported Static Standing - Level of Assistance: 6: Modified independent (Device/Increase time)  End of Session PT - End of Session Equipment Utilized During Treatment: Gait belt Activity Tolerance: Patient tolerated treatment well Patient left: in chair;with call bell/phone within reach Nurse Communication: Mobility status  GP     Ruthann Cancer 04/08/2013, 4:47 PM  Ruthann Cancer, PT, DPT [phone]

## 2013-04-08 NOTE — Progress Notes (Signed)
ANTIBIOTIC CONSULT NOTE - INITIAL  Pharmacy Consult for vancomycin Indication: toe abscess  No Known Allergies  Patient Measurements:    Body Weight: 84.2 kg  Vital Signs: Temp: 97.2 F (36.2 C) (12/09 1330) Temp src: Oral (12/09 1032) BP: 140/85 mmHg (12/09 1320) Pulse Rate: 59 (12/09 1320) Intake/Output from previous day:   Intake/Output from this shift: Total I/O In: 900 [I.V.:900] Out: 50 [Blood:50]  Labs:  Recent Labs  04/06/13 2334 04/08/13 1035  WBC 11.1* 9.1  HGB 13.5 14.1  PLT 252 257  CREATININE 1.03 1.09   The CrCl is unknown because both a height and weight (above a minimum accepted value) are required for this calculation. No results found for this basename: VANCOTROUGH, Leodis Binet, VANCORANDOM, GENTTROUGH, GENTPEAK, GENTRANDOM, TOBRATROUGH, TOBRAPEAK, TOBRARND, AMIKACINPEAK, AMIKACINTROU, AMIKACIN,  in the last 72 hours   Microbiology: Recent Results (from the past 720 hour(s))  WOUND CULTURE     Status: None   Collection Time    04/07/13 12:16 AM      Result Value Range Status   Specimen Description WOUND RIGHT FOOT TOE   Final   Special Requests Normal   Final   Gram Stain     Final   Value: ABUNDANT WBC PRESENT, PREDOMINANTLY PMN     RARE SQUAMOUS EPITHELIAL CELLS PRESENT     MODERATE GRAM POSITIVE COCCI IN PAIRS     IN CLUSTERS FEW GRAM POSITIVE RODS     Performed at [facility]   Culture     Final   Value: ABUNDANT GRAM NEGATIVE RODS     Performed at [facility]   Report Status PENDING   Incomplete    Medical History: No past medical history on file.  Medications:  No maintenance meds PTA Assessment: 47 yo man to start vancomycin s/p I&D toe abscess.  MD has ordered vancomycin 1 gm IV q12 hours which is appropriate for weight and renal function.  Goal of Therapy:  Vancomycin trough level 10-15 mcg/ml  Plan:  Cont vanc 1 gm IV q12 hours. F/u renal function, cultures and clinical course.  Evelena Masci  [place] 04/08/2013,2:11 PM

## 2013-04-08 NOTE — H&P (Signed)
PREOPERATIVE H&P  Chief Complaint: Right 2nd toe infection  HPI: Devon Richardson is a 47 y.o. male who presents for surgical treatment of right 2nd toe infection.    No past medical history on file. No past surgical history on file. History   Social History  . Marital Status: Single    Spouse Name: N/A    Number of Children: N/A  . Years of Education: N/A   Social History Main Topics  . Smoking status: Never Smoker   . Smokeless tobacco: None  . Alcohol Use: Yes  . Drug Use: No  . Sexual Activity: None   Other Topics Concern  . None   Social History Narrative  . None   No family history on file. No Known Allergies Prior to Admission medications   Medication Sig Start Date End Date Taking? Authorizing Provider  ciprofloxacin (CIPRO) 500 MG tablet Take 1 tablet (500 mg total) by mouth 2 (two) times daily. 04/07/13  Yes Catherine E Schinlever, PA-C  ibuprofen (ADVIL,MOTRIN) 800 MG tablet Take 1 tablet (800 mg total) by mouth 3 (three) times daily. 04/04/13  Yes Jennifer L Piepenbrink, PA-C  oxyCODONE-acetaminophen (PERCOCET/ROXICET) 5-325 MG per tablet Take 1 tablet by mouth every 4 (four) hours as needed for severe pain. 04/07/13  Yes Catherine E Schinlever, PA-C     Positive ROS: All other systems have been reviewed and were otherwise negative with the exception of those mentioned in the HPI and as above.  Physical Exam: General: Alert, no acute distress Cardiovascular: No pedal edema Respiratory: No cyanosis, no use of accessory musculature GI: No organomegaly, abdomen is soft and non-tender Skin: No lesions in the area of chief complaint Neurologic: Sensation intact distally Psychiatric: Patient is competent for consent with normal mood and affect Lymphatic: No axillary or cervical lymphadenopathy  MUSCULOSKELETAL:   Right 2nd toe: - purulent drainage - severe swelling - epidermiolysis - toe wwp  Assessment: Right 2nd toe infection  Plan: Plan for  Procedure(s): IRRIGATION AND DEBRIDEMENT RIGHT 2ND TOE  The risks benefits and alternatives were discussed with the patient including but not limited to the risks of nonoperative treatment, versus surgical intervention including infection, bleeding, nerve injury,  blood clots, cardiopulmonary complications, morbidity, mortality, among others, and they were willing to proceed.   Cheral Almas, MD   04/08/2013 11:20 AM

## 2013-04-08 NOTE — Op Note (Signed)
Date of surgery: 04/08/2013  Preoperative diagnosis: second toe abscess  Postoperative diagnosis: Same  Procedure: Debridement of skin subcutaneous tissue muscle and bone of second toe  Surgeon: Glee Arvin, M.D.  Anesthesia: Gen.  Estimated blood loss: Minimal  Applications: None  Condition to PACU: Stable  Indications for procedure: Mr. Kossman is a 47 year old gentleman that I saw earlier today in clinic who has had worsening toe swelling drainage and pain after he sustained a laceration to this toe chronic cinderblock. He was evaluated twice in the ER and given Cipro. However he failed to respond to this and presented to my clinic today with obvious signs a toe infection. The risks benefits and alternatives to surgery were discussed with the patient and he elected to undergo surgery.  Description of procedure: The patient was identified in the preoperative holding area. The operative site and procedure were confirmed with the patient and marked by the surgeon. He is brought back to the operating room. His placed supine on the table. General anesthesia was induced. A nonsterile tourniquet was placed on the upper thigh. Pre-incisional antibiotics were held in anticipation for cultures. The right lower extremity was prepped and draped in standard sterile fashion. A pre-incision timeout was performed.  We began with extending the open wound on the lateral aspect of the second toe both distally and proximally.  A large amount of purulent drainage was expelled from this wound. 2 cultures were obtained. Scissors were used to bluntly breakup any loculations or pockets of abscess. The infection did not seem to track more proximally than the toe itself. Sharp excisional debridement of the skin subcutaneous tissue muscle and bone was carried out. The skin was cut back to a nice bleeding surface. There was an extensive amount of epidermiolysis.  6 L of normal saline was irrigated through the toe using  cystoscopy tubing. The wound was then packed with iodoform.  A sterile dressing was placed. The patient awoke from anesthesia uneventfully and transferred to the PACU.  Disposition: The patient will be admitted to the orthopedic unit. We will follow his cultures.  He will remain on IV vancomycin per pharmacy dosing. Depending on the cultures we may obtain a consultation from infectious diseases. Patient will be heel weightbearing as tolerated and begin dial soap soaks to the foot tomorrow.  We will follow him for any clinical worsening.  Mayra Reel, MD [facility] [phone] 12:21 PM

## 2013-04-08 NOTE — Transfer of Care (Signed)
Immediate Anesthesia Transfer of Care Note  Patient: Devon Richardson  Procedure(s) Performed: Procedure(s): IRRIGATION AND DEBRIDEMENT RIGHT 2ND TOE (Right)  Patient Location: PACU  Anesthesia Type:General  Level of Consciousness: sedated and responds to stimulation  Airway & Oxygen Therapy: Patient Spontanous Breathing and Patient connected to face mask oxygen  Post-op Assessment: Report given to PACU RN and Post -op Vital signs reviewed and stable  Post vital signs: Reviewed and stable  Complications: No apparent anesthesia complications

## 2013-04-08 NOTE — Anesthesia Preprocedure Evaluation (Signed)
Anesthesia Evaluation  Patient identified by MRN, date of birth, ID band Patient awake    Reviewed: Allergy & Precautions, H&P , NPO status , Patient's Chart, lab work & pertinent test results  Airway Mallampati: II TM Distance: >3 FB Neck ROM: Full    Dental  (+) Teeth Intact and Dental Advisory Given   Pulmonary  breath sounds clear to auscultation        Cardiovascular Rhythm:Regular Rate:Normal     Neuro/Psych    GI/Hepatic   Endo/Other    Renal/GU      Musculoskeletal   Abdominal   Peds  Hematology   Anesthesia Other Findings   Reproductive/Obstetrics                           Anesthesia Physical Anesthesia Plan  ASA: I  Anesthesia Plan: General   Post-op Pain Management:    Induction: Intravenous  Airway Management Planned: LMA  Additional Equipment:   Intra-op Plan:   Post-operative Plan:   Informed Consent: I have reviewed the patients History and Physical, chart, labs and discussed the procedure including the risks, benefits and alternatives for the proposed anesthesia with the patient or authorized representative who has indicated his/her understanding and acceptance.   Dental advisory given  Plan Discussed with: CRNA and Anesthesiologist  Anesthesia Plan Comments:         Anesthesia Quick Evaluation

## 2013-04-09 ENCOUNTER — Encounter (HOSPITAL_COMMUNITY): Payer: Self-pay | Admitting: General Practice

## 2013-04-09 DIAGNOSIS — L089 Local infection of the skin and subcutaneous tissue, unspecified: Secondary | ICD-10-CM

## 2013-04-09 LAB — BASIC METABOLIC PANEL
BUN: 13 mg/dL (ref 6–23)
Chloride: 101 mEq/L (ref 96–112)
Creatinine, Ser: 1.05 mg/dL (ref 0.50–1.35)
GFR calc Af Amer: >90 mL/min (ref >90)
Glucose, Bld: 96 mg/dL (ref 70–99)
Potassium: 4.5 mEq/L (ref 3.5–5.1)
Sodium: 136 mEq/L (ref 135–145)

## 2013-04-09 LAB — CBC WITH DIFFERENTIAL/PLATELET
Basophils Absolute: 0 10*3/uL (ref 0.0–0.1)
Basophils Relative: 0 % (ref 0–1)
Eosinophils Absolute: 0.2 10*3/uL (ref 0.0–0.7)
Eosinophils Relative: 3 % (ref 0–5)
HCT: 36.5 % — ABNORMAL LOW (ref 39.0–52.0)
MCH: 29.6 pg (ref 26.0–34.0)
MCHC: 33.2 g/dL (ref 30.0–36.0)
MCV: 89.2 fL (ref 78.0–100.0)
Monocytes Absolute: 0.8 10*3/uL (ref 0.1–1.0)
RBC: 4.09 MIL/uL — ABNORMAL LOW (ref 4.22–5.81)
RDW: 13.3 % (ref 11.5–15.5)

## 2013-04-09 MED ORDER — TETANUS-DIPHTHERIA TOXOIDS TD 5-2 LFU IM INJ
0.5000 mL | INJECTION | Freq: Once | INTRAMUSCULAR | Status: AC
  Administered 2013-04-09: 0.5 mL via INTRAMUSCULAR
  Filled 2013-04-09: qty 0.5

## 2013-04-09 MED ORDER — PIPERACILLIN-TAZOBACTAM 3.375 G IVPB
3.3750 g | Freq: Three times a day (TID) | INTRAVENOUS | Status: DC
  Administered 2013-04-09 – 2013-04-10 (×3): 3.375 g via INTRAVENOUS
  Filled 2013-04-09 (×5): qty 50

## 2013-04-09 MED ORDER — VANCOMYCIN HCL IN DEXTROSE 1-5 GM/200ML-% IV SOLN
1000.0000 mg | Freq: Two times a day (BID) | INTRAVENOUS | Status: DC
  Administered 2013-04-09 – 2013-04-10 (×3): 1000 mg via INTRAVENOUS
  Filled 2013-04-09 (×4): qty 200

## 2013-04-09 NOTE — Evaluation (Signed)
Occupational Therapy Evaluation Patient Details Name: Zacchary Pompei MRN: [id number] DOB: 1965-07-28 Today's Date: 04/09/2013 Time: 7829-5621 OT Time Calculation (min): 20 min  OT Assessment / Plan / Recommendation History of present illness Pt is a 47 y/o male admitted for right I&D of 2nd toe after sustaining a laceration to this toe via a cinderblock at work and infection that was not responding to antibiotics given in the ED.    Clinical Impression   Pt presents w/ dx as above. Overall Mod I functional transfers (bed, chair, toilet) using crutches. Pt educated in LB bathing/dressing techniques & performed tub transfer in ADL gym today w/ Mod I-supervision for safety. Recommend intermittent/PRN assist at d/c, no futher OT needs anticipated. Will sign off at this time.     OT Assessment  Patient does not need any further OT services    Follow Up Recommendations  No OT follow up    Barriers to Discharge      Equipment Recommendations  None recommended by OT    Recommendations for Other Services    Frequency       Precautions / Restrictions Precautions Precautions: Fall Restrictions Weight Bearing Restrictions: Yes RLE Weight Bearing: Weight bearing as tolerated Other Position/Activity Restrictions: WB only through heel   Pertinent Vitals/Pain Pain not rated, c/o "Sorness" right heel. Pt was repositioned, R LE elevated on pillows and ice applied, resting at  conclusion of treatment session.    ADL  Eating/Feeding: Simulated;Independent Grooming: Simulated;Independent Where Assessed - Grooming: Supported standing Upper Body Bathing: Simulated;Modified independent;Set up Where Assessed - Upper Body Bathing: Unsupported sitting Lower Body Bathing: Simulated;Modified independent;Set up Where Assessed - Lower Body Bathing: Supported sit to stand Upper Body Dressing: Performed;Modified independent Where Assessed - Upper Body Dressing: Unsupported sitting Lower Body  Dressing: Simulated;Independent Where Assessed - Lower Body Dressing: Unsupported sitting Toilet Transfer: Performed;Modified independent Toilet Transfer Method: Sit to Barista: Comfort height toilet;Grab bars Toileting - Clothing Manipulation and Hygiene: Performed;Modified independent Where Assessed - Toileting Clothing Manipulation and Hygiene: Standing;Sit to stand from 3-in-1 or toilet Tub/Shower Transfer: Performed;Modified independent;Supervision/safety Tub/Shower Transfer Method: Ambulating;Other (comment) (Pt stepped into tub in ADL gym, sat on ledge for simulated bathing) Tub/Shower Transfer Equipment: Other (comment) (Pt has grab bar at home in tub noted.) Equipment Used: Other (comment) (Crutches) Transfers/Ambulation Related to ADLs: Pt overall Mod I functional mobiltiy using crutches. Mod I toilet transfers noted, supervision-Mod I tub transfers in ADL gym today. ADL Comments: Pt participated in ADL retraining activity in ADL Gym. He is overall Mod I-supervision for tub transfers, discussed LB bathing and dressing techniques, pt reports independence & states no further acute OT needs. Recommend supervision assist for transfers secondary to IV/lines, pt verbalized understanding of this. He will have PRN assist at d/c per his report.    OT Diagnosis:    OT Problem List:   OT Treatment Interventions:     OT Goals(Current goals can be found in the care plan section) Acute Rehab OT Goals Patient Stated Goal: To go home, back to work  Visit Information  Last OT Received On: 04/09/13 History of Present Illness: Pt is a 47 y/o male admitted for right I&D of 2nd toe after sustaining a laceration to this toe via a cinderblock at work and infection that was not responding to antibiotics given in the ED.        Prior Functioning     Home Living Family/patient expects to be discharged to:: Private residence Living Arrangements:  Non-relatives/Friends Available Help at Discharge: Friend(s);Available PRN/intermittently Type of Home: Apartment Home Access: Stairs to enter Entrance Stairs-Number of Steps: 15 Entrance Stairs-Rails: Right;Left Home Layout: One level Home Equipment: Crutches Prior Function Level of Independence: Independent Communication Communication: No difficulties Dominant Hand: Right    Vision/Perception Vision - History Patient Visual Report: No change from baseline   Cognition  Cognition Arousal/Alertness: Awake/alert Behavior During Therapy: WFL for tasks assessed/performed Overall Cognitive Status: Within Functional Limits for tasks assessed    Extremity/Trunk Assessment Upper Extremity Assessment Upper Extremity Assessment: Overall WFL for tasks assessed Lower Extremity Assessment Lower Extremity Assessment: Defer to PT evaluation Cervical / Trunk Assessment Cervical / Trunk Assessment: Normal    Mobility Bed Mobility Bed Mobility: Supine to Sit;Sitting - Scoot to Edge of Bed Supine to Sit: 7: Independent;HOB flat Sitting - Scoot to Edge of Bed: 7: Independent Details for Bed Mobility Assistance: No assist required. Pt able to transition to EOB without use of bed rails. Transfers Transfers: Sit to Stand;Stand to Sit Sit to Stand: 6: Modified independent (Device/Increase time);From bed;With upper extremity assist;From chair/3-in-1;From toilet;Other (comment) (From edge of tub in ADL gym) Stand to Sit: 6: Modified independent (Device/Increase time);To chair/3-in-1;With upper extremity assist;To bed;To toilet Details for Transfer Assistance: Pt pushing up from crutches in one hand and off seated surface with the other hand. No physical assist needed. Pt able to maintain WBAT through heel only. Pt noted to be Mod I-supervision level for tub transfer today in ADL gym.        Balance Balance Balance Assessed: Yes Static Sitting Balance Static Sitting - Balance Support: Feet  supported;No upper extremity supported Static Sitting - Level of Assistance: 6: Modified independent (Device/Increase time) Static Standing Balance Static Standing - Balance Support: No upper extremity supported Static Standing - Level of Assistance: 6: Modified independent (Device/Increase time)   End of Session OT - End of Session Equipment Utilized During Treatment: Other (comment) (Crutches) Activity Tolerance: Patient tolerated treatment well Patient left: in chair;with call bell/phone within reach  GO     Roselie Awkward Dixon 04/09/2013, 9:43 AM

## 2013-04-09 NOTE — Progress Notes (Signed)
ANTIBIOTIC CONSULT NOTE - INITIAL  Pharmacy Consult for zosyn Indication: R foot infection  No Known Allergies  Patient Measurements:   Adjusted Body Weight:   Vital Signs: Temp: 97.9 F (36.6 C) (12/10 1350) BP: 136/79 mmHg (12/10 1350) Pulse Rate: 59 (12/10 1350) Intake/Output from previous day: 12/09 0701 - 12/10 0700 In: 2700 [P.O.:1110; I.V.:1340; IV Piggyback:250] Out: 1250 [Urine:1200; Blood:50] Intake/Output from this shift: Total I/O In: 480 [P.O.:480] Out: 400 [Urine:400]  Labs:  Recent Labs  04/06/13 2334 04/08/13 1035 04/09/13 0405  WBC 11.1* 9.1 6.7  HGB 13.5 14.1 12.1*  PLT 252 257 251  CREATININE 1.03 1.09 1.05   The CrCl is unknown because both a height and weight (above a minimum accepted value) are required for this calculation. No results found for this basename: VANCOTROUGH, VANCOPEAK, VANCORANDOM, GENTTROUGH, GENTPEAK, GENTRANDOM, TOBRATROUGH, TOBRAPEAK, TOBRARND, AMIKACINPEAK, AMIKACINTROU, AMIKACIN,  in the last 72 hours   Microbiology: Recent Results (from the past 720 hour(s))  WOUND CULTURE     Status: None   Collection Time    04/07/13 12:16 AM      Result Value Range Status   Specimen Description WOUND RIGHT FOOT TOE   Final   Special Requests Normal   Final   Gram Stain     Final   Value: ABUNDANT WBC PRESENT, PREDOMINANTLY PMN     RARE SQUAMOUS EPITHELIAL CELLS PRESENT     MODERATE GRAM POSITIVE COCCI IN PAIRS     IN CLUSTERS FEW GRAM POSITIVE RODS     Performed at [facility]   Culture     Final   Value: ABUNDANT GRAM NEGATIVE RODS     ABUNDANT STAPHYLOCOCCUS AUREUS     Note: RIFAMPIN AND GENTAMICIN SHOULD NOT BE USED AS SINGLE DRUGS FOR TREATMENT OF STAPH INFECTIONS.     Performed at [facility]   Report Status PENDING   Incomplete  WOUND CULTURE     Status: None   Collection Time    04/08/13 11:47 AM      Result Value Range Status   Specimen Description WOUND RIGHT FOOT   Final   Special Requests RIGHT  SECOND TOE    Final   Gram Stain     Final   Value: ABUNDANT WBC PRESENT, PREDOMINANTLY PMN     NO SQUAMOUS EPITHELIAL CELLS SEEN     FEW GRAM POSITIVE COCCI IN CLUSTERS     Performed at [facility]   Culture     Final   Value: Culture reincubated for better growth     Performed at [facility]   Report Status PENDING   Incomplete    Medical History: Past Medical History  Diagnosis Date  . Abscess of second toe 03/2013    RIGHT FOOT    Medications:  Scheduled:  . senna  1 tablet Oral BID  . tetanus & diphtheria toxoids (adult)  0.5 mL Intramuscular Once  . vancomycin  1,000 mg Intravenous Q12H   Infusions:  . sodium chloride    . lactated ringers 50 mL/hr at 04/08/13 1054   Assessment: 47 yo male with R foot infection will be added zosyn for gram negative coverage.  SCr 1.05.  Plan:  1) Zosyn 3.375g iv q8h (4hr infusion) 2) Monitor culture and sensitivity and plan on antibiotic.   Devon Richardson, Tsz-Yin 04/09/2013,5:12 PM

## 2013-04-09 NOTE — Progress Notes (Signed)
   Subjective:  Patient reports pain as mild.  No events  Objective:   VITALS:   Filed Vitals:   04/08/13 1345 04/08/13 1945 04/08/13 2312 04/09/13 0435  BP: 151/90 145/55 141/93 122/65  Pulse: 64 72 58 57  Temp: 97.9 F (36.6 C) 98.7 F (37.1 C) 98.1 F (36.7 C) 98.4 F (36.9 C)  TempSrc:      Resp: 18 18 18 19   SpO2: 100% 100% 100% 100%    - swelling improved in toe and foot - no cellulitis - no purulent drainage - no signs of continued infection - toe wwp   Lab Results  Component Value Date   WBC 6.7 04/09/2013   HGB 12.1* 04/09/2013   HCT 36.5* 04/09/2013   MCV 89.2 04/09/2013   PLT 251 04/09/2013     Assessment/Plan: 1 Day Post-Op   Problem List Items Addressed This Visit   None      Advance diet Up with therapy Up with PT/OT DVT ppx - SCDs, ambulation Continue IV abx due to infection of toe Follow cultures Will remove packing this am and begin DSS BID WBAT right and lower extremity Pain control Discharge planning   Cheral Almas 04/09/2013, 7:43 AM [phone]

## 2013-04-09 NOTE — Consult Note (Addendum)
INFECTIOUS DISEASE CONSULT NOTE  Date of Admission:  04/08/2013  Date of Consult:  04/09/2013  Reason for Consult: R foot infection Referring Physician: Roda Shutters  Impression/Recommendation R foot infection  Would: Place PIC Check HIV Add gram-negative coverage Continue vancomycin Tetanus vaccine (last documented 1996)  Comment: He is to culture from his foot wound. The one from the shows staph aureus as well as gram-negative rods. The one from the ninth shows gram-positive cocci in clusters. It is difficult to know about authenticity of his culture from December 8, this could be a contaminate with superficial skin flora. However, given that his wound was incurred while inside of a boot, it would not be out of the realm of possibilities that he could have gram-negative rods in his foot.  Thank you so much for this interesting consult,   Johny Sax (pager) [phone] [email or web address]  Devon Richardson is an 47 y.o. male.  HPI: 47 year old male with no past medical history comes to the hospital on December 9 after injuring his right foot November 29. Afterwards he began to soak his foot to try to improve the pain. He developed more swelling and was seen in the emergency room on December 5. He was given Vicodin ibuprofen. By December 7,he had worsening swelling of his right foot centered around his second toe. He developed oozing from this as well swelling involving the adjacent toes. He was discharged in the emergency room and told to follow up with orthopedics, which he did. He was admitted to the hospital on 12-9 and taken to the OR for debridement of his foot.  Past Medical History  Diagnosis Date  . Abscess of second toe 03/2013    RIGHT FOOT    Past Surgical History  Procedure Laterality Date  . Debridement toe Right 04/08/2013    2ND TOE         No Known Allergies  Medications:  Scheduled: . senna  1 tablet Oral BID  . vancomycin  1,000 mg Intravenous Q12H     Total days of antibiotics :1 (vanco)          Social History:  reports that he has never smoked. He has never used smokeless tobacco. He reports that he drinks alcohol. He reports that he does not use illicit drugs.  History reviewed. No pertinent family history.  General ROS: No dysphagia. No fever or chills. No diarrhea or constipation. No change in weight. See history of present illness.  Blood pressure 136/79, pulse 59, temperature 97.9 F (36.6 C), temperature source Oral, resp. rate 18, SpO2 99.00%. General appearance: alert, cooperative and no distress Eyes: negative findings: pupils equal, round, reactive to light and accomodation Neck: no adenopathy and supple, symmetrical, trachea midline Lungs: clear to auscultation bilaterally Heart: regular rate and rhythm Abdomen: normal findings: bowel sounds normal and soft, non-tender Extremities: edema none and Right foot is wrapped. I'm unable to visualize his wound. He has grossly normal light touch on his right great toe.   Results for orders placed during the hospital encounter of 04/08/13 (from the past 48 hour(s))  BASIC METABOLIC PANEL     Status: Abnormal   Collection Time    04/08/13 10:35 AM      Result Value Range   Sodium 135  135 - 145 mEq/L   Potassium 3.8  3.5 - 5.1 mEq/L   Chloride 97  96 - 112 mEq/L   CO2 26  19 - 32 mEq/L   Glucose, Bld 88  70 -  99 mg/dL   BUN 13  6 - 23 mg/dL   Creatinine, Ser 1.61  0.50 - 1.35 mg/dL   Calcium 9.3  8.4 - 09.6 mg/dL   GFR calc non Af Amer 79 (*) >90 mL/min   GFR calc Af Amer >90  >90 mL/min   Comment: (NOTE)     The eGFR has been calculated using the CKD EPI equation.     This calculation has not been validated in all clinical situations.     eGFR's persistently <90 mL/min signify possible Chronic Kidney     Disease.  CBC WITH DIFFERENTIAL     Status: None   Collection Time    04/08/13 10:35 AM      Result Value Range   WBC 9.1  4.0 - 10.5 K/uL   RBC 4.77  4.22 -  5.81 MIL/uL   Hemoglobin 14.1  13.0 - 17.0 g/dL   HCT 04.5  40.9 - 81.1 %   MCV 87.0  78.0 - 100.0 fL   MCH 29.6  26.0 - 34.0 pg   MCHC 34.0  30.0 - 36.0 g/dL   RDW 91.4  78.2 - 95.6 %   Platelets 257  150 - 400 K/uL   Neutrophils Relative % 77  43 - 77 %   Neutro Abs 7.0  1.7 - 7.7 K/uL   Lymphocytes Relative 15  12 - 46 %   Lymphs Abs 1.4  0.7 - 4.0 K/uL   Monocytes Relative 7  3 - 12 %   Monocytes Absolute 0.6  0.1 - 1.0 K/uL   Eosinophils Relative 1  0 - 5 %   Eosinophils Absolute 0.1  0.0 - 0.7 K/uL   Basophils Relative 0  0 - 1 %   Basophils Absolute 0.0  0.0 - 0.1 K/uL  SEDIMENTATION RATE     Status: Abnormal   Collection Time    04/08/13 10:35 AM      Result Value Range   Sed Rate 42 (*) 0 - 16 mm/hr  C-REACTIVE PROTEIN     Status: Abnormal   Collection Time    04/08/13 10:35 AM      Result Value Range   CRP 5.4 (*) <0.60 mg/dL   Comment: Performed at [facility]  WOUND CULTURE     Status: None   Collection Time    04/08/13 11:47 AM      Result Value Range   Specimen Description WOUND RIGHT FOOT     Special Requests RIGHT SECOND TOE      Gram Stain       Value: ABUNDANT WBC PRESENT, PREDOMINANTLY PMN     NO SQUAMOUS EPITHELIAL CELLS SEEN     FEW GRAM POSITIVE COCCI IN CLUSTERS     Performed at [facility]   Culture       Value: Culture reincubated for better growth     Performed at [facility]   Report Status PENDING    BASIC METABOLIC PANEL     Status: Abnormal   Collection Time    04/09/13  4:05 AM      Result Value Range   Sodium 136  135 - 145 mEq/L   Potassium 4.5  3.5 - 5.1 mEq/L   Chloride 101  96 - 112 mEq/L   CO2 29  19 - 32 mEq/L   Glucose, Bld 96  70 - 99 mg/dL   BUN 13  6 - 23 mg/dL   Creatinine, Ser 2.13  0.50 - 1.35 mg/dL   Calcium 8.5  8.4 - 09.8 mg/dL   GFR calc non Af Amer 83 (*) >90 mL/min   GFR calc Af Amer >90  >90 mL/min   Comment: (NOTE)     The eGFR has been calculated using the CKD EPI equation.      This calculation has not been validated in all clinical situations.     eGFR's persistently <90 mL/min signify possible Chronic Kidney     Disease.  CBC WITH DIFFERENTIAL     Status: Abnormal   Collection Time    04/09/13  4:05 AM      Result Value Range   WBC 6.7  4.0 - 10.5 K/uL   RBC 4.09 (*) 4.22 - 5.81 MIL/uL   Hemoglobin 12.1 (*) 13.0 - 17.0 g/dL   HCT 11.9 (*) 14.7 - 82.9 %   MCV 89.2  78.0 - 100.0 fL   MCH 29.6  26.0 - 34.0 pg   MCHC 33.2  30.0 - 36.0 g/dL   RDW 56.2  13.0 - 86.5 %   Platelets 251  150 - 400 K/uL   Neutrophils Relative % 57  43 - 77 %   Neutro Abs 3.8  1.7 - 7.7 K/uL   Lymphocytes Relative 29  12 - 46 %   Lymphs Abs 2.0  0.7 - 4.0 K/uL   Monocytes Relative 12  3 - 12 %   Monocytes Absolute 0.8  0.1 - 1.0 K/uL   Eosinophils Relative 3  0 - 5 %   Eosinophils Absolute 0.2  0.0 - 0.7 K/uL   Basophils Relative 0  0 - 1 %   Basophils Absolute 0.0  0.0 - 0.1 K/uL      Component Value Date/Time   SDES WOUND RIGHT FOOT 04/08/2013 1147   SPECREQUEST RIGHT SECOND TOE  04/08/2013 1147   CULT  Value: Culture reincubated for better growth Performed at [facility] 04/08/2013 1147   REPTSTATUS PENDING 04/08/2013 1147   No results found. Recent Results (from the past 240 hour(s))  WOUND CULTURE     Status: None   Collection Time    04/07/13 12:16 AM      Result Value Range Status   Specimen Description WOUND RIGHT FOOT TOE   Final   Special Requests Normal   Final   Gram Stain     Final   Value: ABUNDANT WBC PRESENT, PREDOMINANTLY PMN     RARE SQUAMOUS EPITHELIAL CELLS PRESENT     MODERATE GRAM POSITIVE COCCI IN PAIRS     IN CLUSTERS FEW GRAM POSITIVE RODS     Performed at [facility]   Culture     Final   Value: ABUNDANT GRAM NEGATIVE RODS     ABUNDANT STAPHYLOCOCCUS AUREUS     Note: RIFAMPIN AND GENTAMICIN SHOULD NOT BE USED AS SINGLE DRUGS FOR TREATMENT OF STAPH INFECTIONS.     Performed at [facility]   Report Status PENDING    Incomplete  WOUND CULTURE     Status: None   Collection Time    04/08/13 11:47 AM      Result Value Range Status   Specimen Description WOUND RIGHT FOOT   Final   Special Requests RIGHT SECOND TOE    Final   Gram Stain     Final   Value: ABUNDANT WBC PRESENT, PREDOMINANTLY PMN     NO SQUAMOUS EPITHELIAL CELLS SEEN     FEW GRAM POSITIVE COCCI  IN CLUSTERS     Performed at [facility]   Culture     Final   Value: Culture reincubated for better growth     Performed at [facility]   Report Status PENDING   Incomplete      04/09/2013, 4:59 PM     LOS: 1 day

## 2013-04-10 ENCOUNTER — Encounter (HOSPITAL_COMMUNITY): Payer: Self-pay | Admitting: Orthopaedic Surgery

## 2013-04-10 LAB — WOUND CULTURE

## 2013-04-10 MED ORDER — CEFAZOLIN SODIUM 1-5 GM-% IV SOLN
1.0000 g | Freq: Three times a day (TID) | INTRAVENOUS | Status: DC
  Administered 2013-04-10 – 2013-04-11 (×3): 1 g via INTRAVENOUS
  Filled 2013-04-10 (×5): qty 50

## 2013-04-10 MED ORDER — SODIUM CHLORIDE 0.9 % IJ SOLN
10.0000 mL | INTRAMUSCULAR | Status: DC | PRN
  Administered 2013-04-11: 10 mL

## 2013-04-10 MED ORDER — HYDROCODONE-ACETAMINOPHEN 5-325 MG PO TABS: 1.0000 | ORAL_TABLET | Freq: Four times a day (QID) | ORAL | Status: DC | PRN

## 2013-04-10 MED ORDER — ACETAMINOPHEN-CODEINE #3 300-30 MG PO TABS: 1.0000 | ORAL_TABLET | Freq: Four times a day (QID) | ORAL | Status: DC | PRN

## 2013-04-10 MED ORDER — CEFAZOLIN SODIUM 1-5 GM-% IV SOLN: 1.0000 g | Freq: Three times a day (TID) | INTRAVENOUS | Status: DC

## 2013-04-10 NOTE — Care Management Note (Signed)
CARE MANAGEMENT NOTE 04/10/2013  Patient:  Devon Richardson, Devon Richardson   Account Number:  [id number]  Date Initiated:  04/09/2013  Documentation initiated by:  Vance Peper  Subjective/Objective Assessment:   47 yr old male admitted for infection of right 2nd toe. S/p I & D.     Action/Plan:   Cm spoke with patient concerning Home Health needs. Patient will require IV antibioticsfor several weeks. Contacted [facility] liasion- Jodene Nam with referral.   Anticipated DC Date:  04/11/2013   Anticipated DC Plan:  HOME W HOME HEALTH SERVICES      DC Planning Services  CM consult      [facility] Choice  HOME HEALTH   Choice offered to / List presented to:     DME arranged  IV PUMP/EQUIPMENT      DME agency  [facility]     [facility] arranged  HH-1 RN      [facility] agency  [facility]   Status of service:  Completed, signed off Medicare Important Message given?   (If response is "NO", the following Medicare IM given date fields will be blank) Date Medicare IM given:   Date Additional Medicare IM given:    Discharge Disposition:  HOME W HOME HEALTH SERVICES

## 2013-04-10 NOTE — Progress Notes (Signed)
Peripherally Inserted Central Catheter/Midline Placement  The IV Nurse has discussed with the patient and/or persons authorized to consent for the patient, the purpose of this procedure and the potential benefits and risks involved with this procedure.  The benefits include less needle sticks, lab draws from the catheter and patient may be discharged home with the catheter.  Risks include, but not limited to, infection, bleeding, blood clot (thrombus formation), and puncture of an artery; nerve damage and irregular heat beat.  Alternatives to this procedure were also discussed.  PICC/Midline Placement Documentation        Timmothy Sours 04/10/2013, 2:43 PM

## 2013-04-10 NOTE — Progress Notes (Signed)
   Subjective:  Patient reports pain as mild.    Objective:   VITALS:   Filed Vitals:   04/09/13 0435 04/09/13 1350 04/09/13 2102 04/10/13 0435  BP: 122/65 136/79 118/66 114/70  Pulse: 57 59 56 53  Temp: 98.4 F (36.9 C) 97.9 F (36.6 C) 98.8 F (37.1 C) 98.1 F (36.7 C)  TempSrc:      Resp: 19 18 18 16   SpO2: 100% 99% 98% 96%    Right 2nd toe - no signs of continued or worsening infection - tissues are viable - WWP - no purulence   Lab Results  Component Value Date   WBC 6.7 04/09/2013   HGB 12.1* 04/09/2013   HCT 36.5* 04/09/2013   MCV 89.2 04/09/2013   PLT 251 04/09/2013     Assessment/Plan: 2 Days Post-Op   Problem List Items Addressed This Visit     Other   *Toe infection - Primary   Relevant Medications      vancomycin (VANCOCIN) IVPB 1000 mg/200 mL premix      Vanc and zosyn empirically per pharmacy dosing - appreciate their help ID following - appreciate their help Follow cultures - staph, GNRs Up with PT/OT DVT ppx - SCDs, ambulation WBAT right heel only Pain control Discharge planning   Cheral Almas 04/10/2013, 8:14 AM [phone]

## 2013-04-10 NOTE — Progress Notes (Signed)
INFECTIOUS DISEASE PROGRESS NOTE  ID: Devon Richardson is a 47 y.o. male with  Principal Problem:   Toe infection  Subjective: Without complaints  Abtx:  Anti-infectives   Start     Dose/Rate Route Frequency Ordered Stop   04/09/13 1800  piperacillin-tazobactam (ZOSYN) IVPB 3.375 g     3.375 g 12.5 mL/hr over 240 Minutes Intravenous Every 8 hours 04/09/13 1713     04/09/13 1000  vancomycin (VANCOCIN) IVPB 1000 mg/200 mL premix     1,000 mg 200 mL/hr over 60 Minutes Intravenous Every 12 hours 04/09/13 0833     04/08/13 1200  vancomycin (VANCOCIN) 1,000 mg in sodium chloride 0.9 % 250 mL IVPB  Status:  Discontinued     1,000 mg 250 mL/hr over 60 Minutes Intravenous Every 12 hours 04/08/13 1149 04/09/13 0832      Medications:  Scheduled: . piperacillin-tazobactam (ZOSYN)  IV  3.375 g Intravenous Q8H  . senna  1 tablet Oral BID  . vancomycin  1,000 mg Intravenous Q12H    Objective: Vital signs in last 24 hours: Temp:  [98.1 F (36.7 C)-98.8 F (37.1 C)] 98.5 F (36.9 C) (12/11 1425) Pulse Rate:  [53-64] 64 (12/11 1425) Resp:  [16-18] 18 (12/11 1425) BP: (114-124)/(66-70) 124/69 mmHg (12/11 1425) SpO2:  [96 %-98 %] 96 % (12/11 1425)   General appearance: alert, cooperative and no distress Extremities: RUE PIC. R 2nd toe swollnen, packing on lateral side  Lab Results  Recent Labs  04/08/13 1035 04/09/13 0405  WBC 9.1 6.7  HGB 14.1 12.1*  HCT 41.5 36.5*  NA 135 136  K 3.8 4.5  CL 97 101  CO2 26 29  BUN 13 13  CREATININE 1.09 1.05   Liver Panel No results found for this basename: PROT, ALBUMIN, AST, ALT, ALKPHOS, BILITOT, BILIDIR, IBILI,  in the last 72 hours Sedimentation Rate  Recent Labs  04/08/13 1035  ESRSEDRATE 42*   C-Reactive Protein  Recent Labs  04/08/13 1035  CRP 5.4*    Microbiology: Recent Results (from the past 240 hour(s))  WOUND CULTURE     Status: None   Collection Time    04/07/13 12:16 AM      Result Value Range  Status   Specimen Description WOUND RIGHT FOOT TOE   Final   Special Requests Normal   Final   Gram Stain     Final   Value: ABUNDANT WBC PRESENT, PREDOMINANTLY PMN     RARE SQUAMOUS EPITHELIAL CELLS PRESENT     MODERATE GRAM POSITIVE COCCI IN PAIRS     IN CLUSTERS FEW GRAM POSITIVE RODS     Performed at [facility]   Culture     Final   Value: ABUNDANT CITROBACTER KOSERI     ABUNDANT STAPHYLOCOCCUS AUREUS     Note: RIFAMPIN AND GENTAMICIN SHOULD NOT BE USED AS SINGLE DRUGS FOR TREATMENT OF STAPH INFECTIONS. This organism DOES NOT demonstrate inducible Clindamycin resistance in vitro.     Performed at [facility]   Report Status 04/10/2013 FINAL   Final   Organism ID, Bacteria CITROBACTER KOSERI   Final   Organism ID, Bacteria STAPHYLOCOCCUS AUREUS   Final  WOUND CULTURE     Status: None   Collection Time    04/08/13 11:47 AM      Result Value Range Status   Specimen Description WOUND RIGHT FOOT   Final   Special Requests RIGHT SECOND TOE    Final   Gram  Stain     Final   Value: ABUNDANT WBC PRESENT, PREDOMINANTLY PMN     NO SQUAMOUS EPITHELIAL CELLS SEEN     FEW GRAM POSITIVE COCCI IN CLUSTERS     Performed at [facility]   Culture     Final   Value: ABUNDANT STAPHYLOCOCCUS AUREUS     Note: RIFAMPIN AND GENTAMICIN SHOULD NOT BE USED AS SINGLE DRUGS FOR TREATMENT OF STAPH INFECTIONS.     Performed at [facility]   Report Status PENDING   Incomplete    Studies/Results: No results found.   Assessment/Plan: R foot infection, suspected osteo  HIV (-) PIC placed stop vancomycin, change to ancef staph and citrobacter both sensitive to ancef Aim for 1 month of ancef Will have him f/u in ID clinic  Total days of antibiotics: 2 (vanco/zosyn)         Johny Sax Infectious Diseases (pager) [phone] [email or web address] 04/10/2013, 3:08 PM  LOS: 2 days

## 2013-04-10 NOTE — Progress Notes (Signed)
[facility]  Patient Status: New pt for [facility] this admission. [facility] is providing the following services:  HHRN, and Home Infusion Pharmacy for home IV ABX.  Hospital Infusion Coordinator will provide in hospital teaching regarding IV ABX and POC for home regimen to support transition home.  [facility] hospital team will follow to support DC home when deemed appropriate by MD.  If patient discharges after hours, please call [phone].   Sedalia Muta 04/10/2013, 11:18 AM

## 2013-04-11 LAB — WOUND CULTURE

## 2013-04-11 MED ORDER — HEPARIN SOD (PORK) LOCK FLUSH 100 UNIT/ML IV SOLN
250.0000 [IU] | INTRAVENOUS | Status: AC | PRN
  Administered 2013-04-11: 250 [IU]

## 2013-04-11 NOTE — Discharge Summary (Signed)
Physician Discharge Summary  Patient ID: Devon Richardson MRN: [id number] DOB/AGE: 05-16-1965 47 y.o.  Admit date: 04/08/2013 Discharge date: 04/11/2013  Admission Diagnoses:  Toe infection  Discharge Diagnoses:  Principal Problem:   Toe infection   Past Medical History  Diagnosis Date  . Abscess of second toe 03/2013    RIGHT FOOT    Surgeries: Procedure(s): IRRIGATION AND DEBRIDEMENT RIGHT 2ND TOE on 04/08/2013   Consultants (if any): Infectious disease  Discharged Condition: Improved  Hospital Course: Devon Richardson is an 47 y.o. male who was admitted 04/08/2013 with a diagnosis of Toe infection and went to the operating room on 04/08/2013 and underwent the above named procedures.    He was given perioperative antibiotics:      Anti-infectives   Start     Dose/Rate Route Frequency Ordered Stop   04/10/13 1600  ceFAZolin (ANCEF) IVPB 1 g/50 mL premix     1 g 100 mL/hr over 30 Minutes Intravenous 3 times per day 04/10/13 1522     04/10/13 0000  ceFAZolin (ANCEF) 1-5 GM-%     1 g 100 mL/hr over 30 Minutes Intravenous Every 8 hours 04/10/13 1707     04/09/13 1800  piperacillin-tazobactam (ZOSYN) IVPB 3.375 g  Status:  Discontinued     3.375 g 12.5 mL/hr over 240 Minutes Intravenous Every 8 hours 04/09/13 1713 04/10/13 1522   04/09/13 1000  vancomycin (VANCOCIN) IVPB 1000 mg/200 mL premix  Status:  Discontinued     1,000 mg 200 mL/hr over 60 Minutes Intravenous Every 12 hours 04/09/13 0833 04/10/13 1522   04/08/13 1200  vancomycin (VANCOCIN) 1,000 mg in sodium chloride 0.9 % 250 mL IVPB  Status:  Discontinued     1,000 mg 250 mL/hr over 60 Minutes Intravenous Every 12 hours 04/08/13 1149 04/09/13 0832    .  He was given sequential compression devices, early ambulation for DVT prophylaxis.    He benefited maximally from the hospital stay and there were no complications.  His toe improved after the procedure and ID was consulted for recommendations on antibiotic  therapy and duration.   Recent vital signs:  Filed Vitals:   04/11/13 0504  BP: 119/74  Pulse: 57  Temp: 98.4 F (36.9 C)  Resp: 18    Recent laboratory studies:  Lab Results  Component Value Date   HGB 12.1* 04/09/2013   HGB 14.1 04/08/2013   HGB 13.5 04/06/2013   Lab Results  Component Value Date   WBC 6.7 04/09/2013   PLT 251 04/09/2013   No results found for this basename: INR   Lab Results  Component Value Date   NA 136 04/09/2013   K 4.5 04/09/2013   CL 101 04/09/2013   CO2 29 04/09/2013   BUN 13 04/09/2013   CREATININE 1.05 04/09/2013   GLUCOSE 96 04/09/2013    Discharge Medications:     Medication List    STOP taking these medications       ciprofloxacin 500 MG tablet  Commonly known as:  CIPRO     oxyCODONE-acetaminophen 5-325 MG per tablet  Commonly known as:  PERCOCET/ROXICET      TAKE these medications       acetaminophen-codeine 300-30 MG per tablet  Commonly known as:  TYLENOL #3  Take 1 tablet by mouth every 6 (six) hours as needed for moderate pain.     ceFAZolin 1-5 GM-%  Commonly known as:  ANCEF  Inject 50 mLs (1 g total) into the vein every  8 (eight) hours.     HYDROcodone-acetaminophen 5-325 MG per tablet  Commonly known as:  NORCO  Take 1-2 tablets by mouth every 6 (six) hours as needed.     ibuprofen 800 MG tablet  Commonly known as:  ADVIL,MOTRIN  Take 1 tablet (800 mg total) by mouth 3 (three) times daily.        Diagnostic Studies: Dg Foot Complete Right  2013/04/22   CLINICAL DATA:  Injury to right second toe, with worsening swelling and laceration. Soft tissue infection; concern for osteomyelitis.  EXAM: RIGHT FOOT COMPLETE - 3+ VIEW  COMPARISON:  Right foot radiographs performed 04/04/2013  FINDINGS: There is significantly increased soft tissue swelling about the proximal second toe. There is no evidence of osseous erosion at this time to suggest osteomyelitis. There is no evidence of fracture or dislocation.  Visualized joint spaces are grossly preserved.  Dorsal soft tissue swelling is also noted at the forefoot. No radiopaque foreign bodies are seen.  IMPRESSION: No evidence of osseous erosion to suggest osteomyelitis. Significantly increased soft tissue swelling noted about the proximal second toe.   Electronically Signed   By: Roanna Raider M.D.   On: April 22, 2013 01:02   Dg Foot Complete Right  04/04/2013   CLINICAL DATA:  Right foot pain in the region of the 2nd MTP joint following an injury.  EXAM: RIGHT FOOT COMPLETE - 3+ VIEW  COMPARISON:  None.  FINDINGS: Distal soft tissue swelling. No fracture or dislocation. Accessory ossification centers adjacent to the navicular.  IMPRESSION: No fracture.   Electronically Signed   By: Gordan Payment M.D.   On: 04/04/2013 19:13    Disposition: 01-Home or Self Care  Discharge Orders   Future Appointments Provider Department Dept Phone   04/21/2013 3:15 PM Randall Hiss, MD [facility] for Infectious Disease [phone]   Future Orders Complete By Expires   Call MD / Call 911  As directed    Comments:     If you experience chest pain or shortness of breath, CALL 911 and be transported to the hospital emergency room.  If you develope a fever above 101.5 F, pus (white drainage) or increased drainage or redness at the wound, or calf pain, call your surgeon's office.   Constipation Prevention  As directed    Comments:     Drink plenty of fluids.  Prune juice may be helpful.  You may use a stool softener, such as Colace (over the counter) 100 mg twice a day.  Use MiraLax (over the counter) for constipation as needed.   Diet - low sodium heart healthy  As directed    Discharge instructions  As directed    Comments:     1. Perform wet to dry dressing changes twice a day until wound is healed 2. Weight bearing as tolerated to heel only.  Do not walk on toes 3. Antibacterial soap soaks twice a day 4. Do not soak foot in anything else    Driving restrictions  As directed    Comments:     No driving while taking narcotic pain meds.   Increase activity slowly as tolerated  As directed       Follow-up Information   Follow up with Cheral Almas, MD In 1 week.   Specialty:  Orthopedic Surgery   Contact information:   [address] [place] [address] [phone]        Signed: Cheral Almas 04/11/2013, 11:23 AM

## 2013-04-11 NOTE — Progress Notes (Signed)
Physical Therapy Treatment Patient Details Name: Devon Richardson MRN: [id number] DOB: 1965-09-29 Today's Date: 04/11/2013 Time: 0865-7846 PT Time Calculation (min): 14 min  PT Assessment / Plan / Recommendation  History of Present Illness Pt is a 47 y/o male admitted for right I&D of 2nd toe after sustaining a laceration to this toe via a cinderblock at work and infection that was not responding to antibiotics given in the ED.    PT Comments   Pt moving very well.  States he is not having any pain & that he feels really good.  Safe to d/c home from mobility standpoint.     Follow Up Recommendations  No PT follow up     Does the patient have the potential to tolerate intense rehabilitation     Barriers to Discharge        Equipment Recommendations  None recommended by PT    Recommendations for Other Services    Frequency Min 3X/week   Progress towards PT Goals Progress towards PT goals: Progressing toward goals  Plan Current plan remains appropriate    Precautions / Restrictions Restrictions Weight Bearing Restrictions: Yes RLE Weight Bearing: Weight bearing as tolerated (Thru right heel only) Other Position/Activity Restrictions: WB only through heel   Pertinent Vitals/Pain Denies pain.      Mobility  Bed Mobility Bed Mobility: Not assessed Transfers Transfers: Sit to Stand;Stand to Sit Sit to Stand: 6: Modified independent (Device/Increase time);With upper extremity assist;With armrests;From chair/3-in-1 Stand to Sit: 6: Modified independent (Device/Increase time);With upper extremity assist;With armrests;To chair/3-in-1 Ambulation/Gait Ambulation/Gait Assistance: 6: Modified independent (Device/Increase time) Ambulation Distance (Feet): 80 Feet Assistive device: Crutches Ambulation/Gait Assistance Details: Pt demonstrates safe use of crutches & WBing through Rt heel.   Gait Pattern: Step-through pattern Stairs: Yes Stairs Assistance: 5: Supervision Stairs  Assistance Details (indicate cue type and reason): cues for sequencing & crutch placement.   Stair Management Technique: No rails;Forwards;With crutches Number of Stairs: 5 Wheelchair Mobility Wheelchair Mobility: No          PT Goals (current goals can now be found in the care plan section) Acute Rehab PT Goals Patient Stated Goal: To go home, back to work PT Goal Formulation: With patient Time For Goal Achievement: 04/15/13 Potential to Achieve Goals: Good  Visit Information  Last PT Received On: 04/11/13 Assistance Needed: +1 History of Present Illness: Pt is a 47 y/o male admitted for right I&D of 2nd toe after sustaining a laceration to this toe via a cinderblock at work and infection that was not responding to antibiotics given in the ED.     Subjective Data  Patient Stated Goal: To go home, back to work   Cognition  Cognition Arousal/Alertness: Awake/alert Behavior During Therapy: WFL for tasks assessed/performed Overall Cognitive Status: Within Functional Limits for tasks assessed    Balance     End of Session PT - End of Session Equipment Utilized During Treatment: Gait belt Activity Tolerance: Patient tolerated treatment well Patient left: in chair;with call bell/phone within reach Nurse Communication: Mobility status   GP     Devon Richardson 04/11/2013, 12:28 PM   Verdell Face, PTA [phone] 04/11/2013

## 2013-04-11 NOTE — Progress Notes (Signed)
   Subjective:  Patient reports pain as mild.    Objective:   VITALS:   Filed Vitals:   04/10/13 0435 04/10/13 1425 04/10/13 2038 04/11/13 0504  BP: 114/70 124/69 119/69 119/74  Pulse: 53 64 59 57  Temp: 98.1 F (36.7 C) 98.5 F (36.9 C) 98.6 F (37 C) 98.4 F (36.9 C)  TempSrc:      Resp: 16 18 18 18   SpO2: 96% 96% 100% 100%    exam stable   Lab Results  Component Value Date   WBC 6.7 04/09/2013   HGB 12.1* 04/09/2013   HCT 36.5* 04/09/2013   MCV 89.2 04/09/2013   PLT 251 04/09/2013     Assessment/Plan: 3 Days Post-Op   Problem List Items Addressed This Visit     Other   *Toe infection - Primary   Relevant Medications      ceFAZolin (ANCEF) IVPB 1 g/50 mL premix      ceFAZolin (ANCEF) 1-5 GM-%   Other Relevant Orders      Call MD / Call 911      Diet - low sodium heart healthy      Constipation Prevention      Increase activity slowly as tolerated      Driving restrictions      Discharge instructions      IV ancef for 1 month per ID F/u 1 week in office for wound check Discharge planning - plan to d/c today with home health   Cheral Almas 04/11/2013, 9:44 AM [phone]

## 2013-04-21 ENCOUNTER — Inpatient Hospital Stay: Payer: Self-pay | Admitting: Infectious Disease

## 2013-05-07 ENCOUNTER — Telehealth: Payer: Self-pay | Admitting: *Deleted

## 2013-05-07 NOTE — Telephone Encounter (Signed)
Needing to schedule pt for [facility] appt.  Pt No Showed HSFU appt w/ Dr Daiva EvesVan Dam on 04/21/13.  [facility] to obtain alternate phone numbers for the pt.  [facility] stated that the last date of IV abx is 05/09/2013. Message left with alternate contact, Remo LippsAlton Womack.  Pt needs to call [phone] to make hospital f/u appt. Notified [facility] that message was left for pt to call for appt.  [facility] to ask for [facility].

## 2013-07-22 ENCOUNTER — Encounter: Payer: Self-pay | Admitting: Infectious Disease

## 2013-08-19 ENCOUNTER — Encounter: Payer: Self-pay | Admitting: Infectious Diseases

## 2013-09-01 ENCOUNTER — Other Ambulatory Visit: Payer: Self-pay | Admitting: Gastroenterology

## 2014-10-21 IMAGING — CR DG FOOT COMPLETE 3+V*R*
3 series · 3 of 3 positions shown · non-contrast
Comparison: None.

CLINICAL DATA: Right foot pain in the region of the 2nd MTP joint
following an injury.

EXAM:
RIGHT FOOT COMPLETE - 3+ VIEW

[t foot ap right]
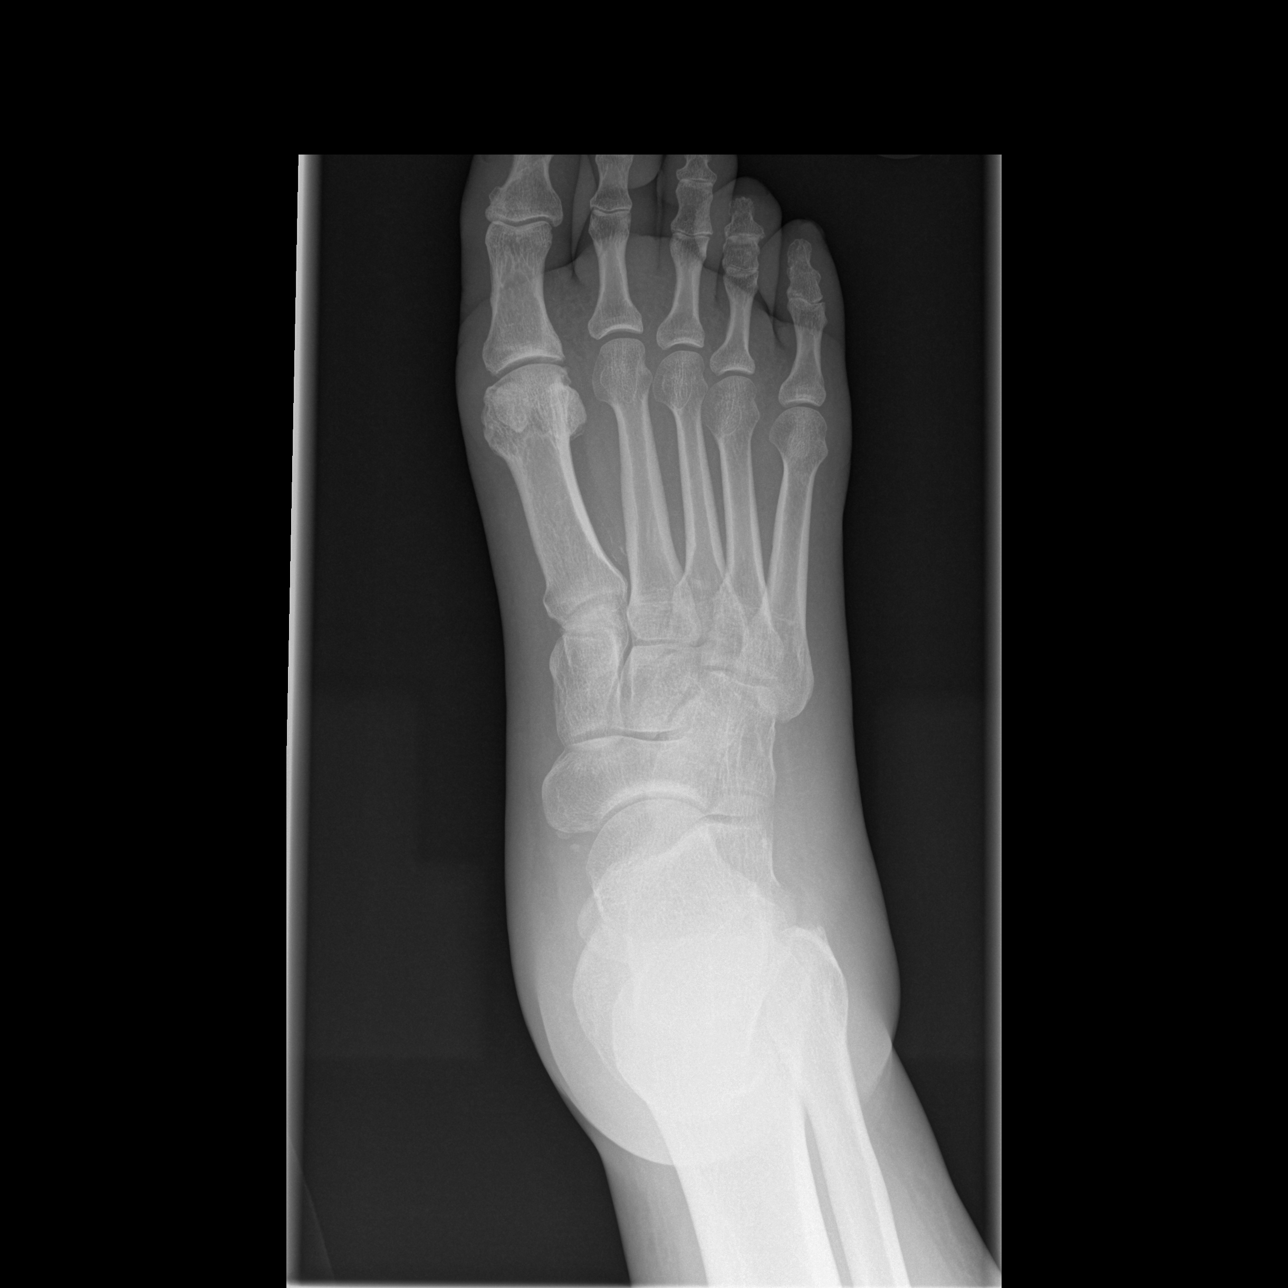

[t foot oblique right]
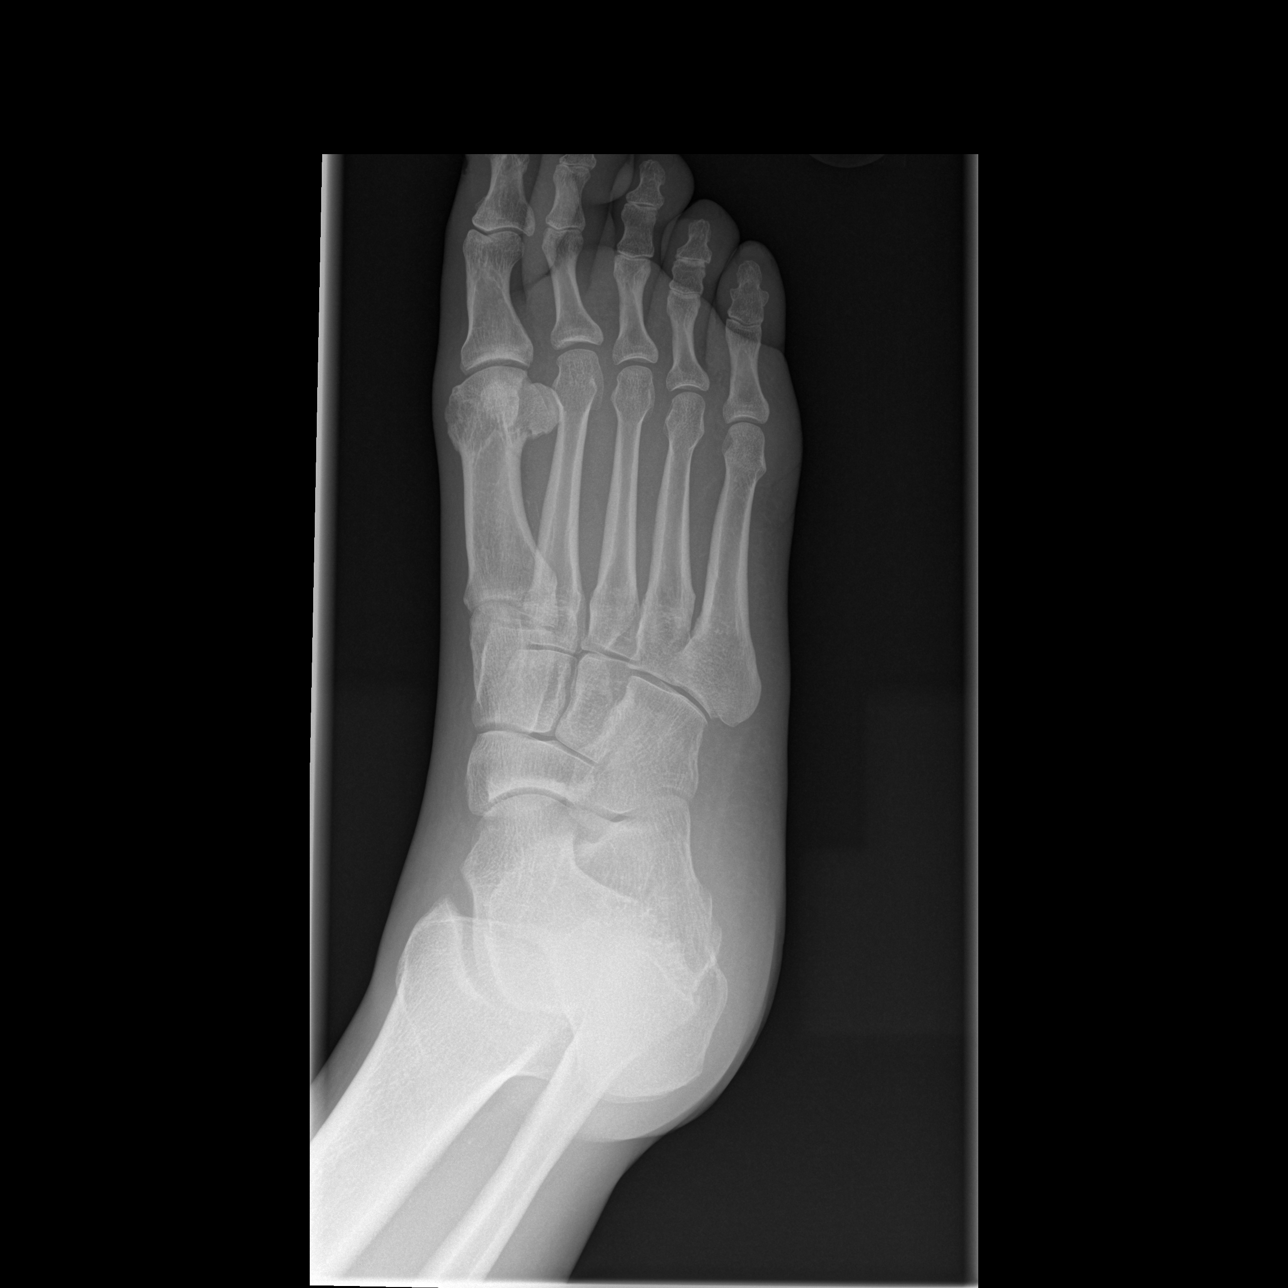

[t foot lat right]
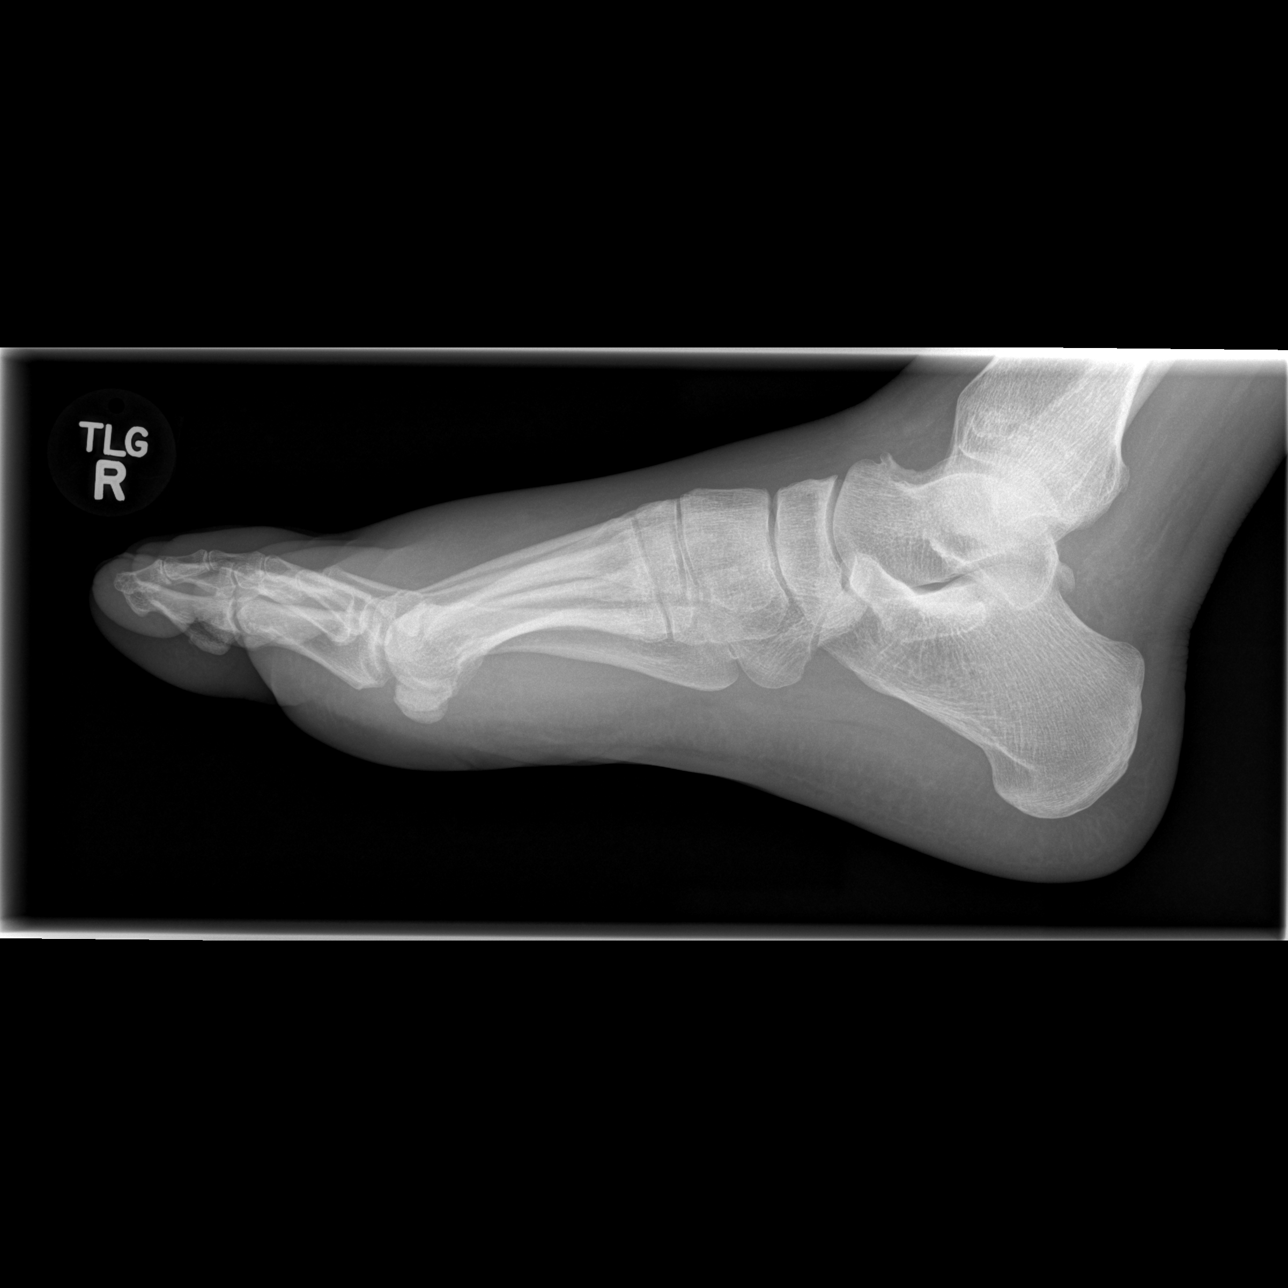

[3 of 3 positions shown; findings below may reference images not displayed]

FINDINGS: Distal soft tissue swelling. No fracture or dislocation. Accessory
ossification centers adjacent to the navicular.
IMPRESSION: No fracture.

## 2015-11-21 ENCOUNTER — Encounter (HOSPITAL_COMMUNITY): Payer: Self-pay

## 2015-11-21 ENCOUNTER — Emergency Department (HOSPITAL_COMMUNITY): Payer: Self-pay

## 2015-11-21 ENCOUNTER — Emergency Department (HOSPITAL_COMMUNITY)
Admission: EM | Admit: 2015-11-21 | Discharge: 2015-11-21 | Disposition: A | Discharge status: Home / Self Care | Payer: Self-pay | Attending: Emergency Medicine | Admitting: Emergency Medicine

## 2015-11-21 DIAGNOSIS — L03115 Cellulitis of right lower limb: Secondary | ICD-10-CM | POA: Insufficient documentation

## 2015-11-21 LAB — BASIC METABOLIC PANEL
ANION GAP: 8 (ref 5–15)
BUN: 9 mg/dL (ref 6–20)
CHLORIDE: 103 mmol/L (ref 101–111)
CO2: 26 mmol/L (ref 22–32)
Calcium: 9.1 mg/dL (ref 8.9–10.3)
Creatinine, Ser: 1.11 mg/dL (ref 0.61–1.24)
GFR calc Af Amer: >60 mL/min (ref >60)
GFR calc non Af Amer: >60 mL/min (ref >60)
GLUCOSE: 95 mg/dL (ref 65–99)
POTASSIUM: 3.7 mmol/L (ref 3.5–5.1)
Sodium: 137 mmol/L (ref 135–145)

## 2015-11-21 LAB — CBC WITH DIFFERENTIAL/PLATELET
Basophils Absolute: 0 10*3/uL (ref 0.0–0.1)
Basophils Relative: 0 %
Eosinophils Absolute: 0.1 10*3/uL (ref 0.0–0.7)
Eosinophils Relative: 1 %
HEMATOCRIT: 40.9 % (ref 39.0–52.0)
Hemoglobin: 13.2 g/dL (ref 13.0–17.0)
LYMPHS ABS: 1.8 10*3/uL (ref 0.7–4.0)
LYMPHS PCT: 19 %
MCH: 28.8 pg (ref 26.0–34.0)
MCHC: 32.3 g/dL (ref 30.0–36.0)
MCV: 89.1 fL (ref 78.0–100.0)
Monocytes Absolute: 1.1 10*3/uL — ABNORMAL HIGH (ref 0.1–1.0)
Monocytes Relative: 12 %
NEUTROS ABS: 6.6 10*3/uL (ref 1.7–7.7)
Neutrophils Relative %: 68 %
Platelets: 235 10*3/uL (ref 150–400)
RBC: 4.59 MIL/uL (ref 4.22–5.81)
RDW: 13.2 % (ref 11.5–15.5)
WBC: 9.7 10*3/uL (ref 4.0–10.5)

## 2015-11-21 MED ORDER — SULFAMETHOXAZOLE-TRIMETHOPRIM 800-160 MG PO TABS: 1.0000 | ORAL_TABLET | Freq: Two times a day (BID) | ORAL | 0 refills | Status: AC

## 2015-11-21 MED ORDER — CEPHALEXIN 500 MG PO CAPS: 500.0000 mg | ORAL_CAPSULE | Freq: Four times a day (QID) | ORAL | 0 refills | Status: DC

## 2015-11-21 NOTE — ED Triage Notes (Signed)
Per pt, Pt started to have right ankle pain and swelling starting on Friday. Pt denies injury or fall. Reports HX of infection and foot surgery in the past. Pulses noted to be 2+ with erythema up to the calf.

## 2015-11-21 NOTE — Discharge Instructions (Signed)
You were seen and evaluated today for your leg swelling and redness.  This appears to be infection in the skin itself.  Please, follow up outpatient with internal medicine for recheck.  Take the antibiotics as prescribed to treat the infection.

## 2015-11-21 NOTE — ED Provider Notes (Signed)
[facility]-EMERGENCY DEPT Provider Note   CSN: [id number] Arrival date & time: 11/21/15  1333  First Provider Contact:  First MD Initiated Contact with Patient 11/21/15 1547        History   Chief Complaint Chief Complaint  Patient presents with  . Ankle Pain    HPI Devon Richardson is a 50 y.o. male.  50 year old male with history of previous second right toe infection requiring I&D presents for right ankle pain and swelling. The patient states this started on Friday and appears similar to when he had infection that had to be drained by orthopedics. He states that the last time he had the toe infection with abscess this was after he had trench foot. He denies any inciting events for the swelling of the skin around his ankle this time. He reports he has decreased range of motion secondary to the swelling but does not have significant pain with range of motion of his ankle. He said that the swelling started on the outside of the ankle and now has spread to the inside of the ankle. He denies fevers or chills. No nausea or vomiting. Denies any injury. He does work on mottling floors. He says that the skin of his leg has started to become red farther up in the swelling has spread with the redness as well.    Ankle Pain   Pertinent negatives include no numbness.    Past Medical History:  Diagnosis Date  . Abscess of second toe 03/2013   RIGHT FOOT    Patient Active Problem List   Diagnosis Date Noted  . Toe infection 04/08/2013    Past Surgical History:  Procedure Laterality Date  . DEBRIDEMENT TOE Right 04/08/2013   2ND TOE      . I&D EXTREMITY Right 04/08/2013   Procedure: IRRIGATION AND DEBRIDEMENT RIGHT 2ND TOE;  Surgeon: Cheral Almas, MD;  Location: [facility] OR;  Service: Orthopedics;  Laterality: Right;       Home Medications    Prior to Admission medications   Medication Sig Start Date End Date Taking? Authorizing Provider  acetaminophen-codeine (TYLENOL #3) 300-30  MG per tablet Take 1 tablet by mouth every 6 (six) hours as needed for moderate pain. 04/10/13   Tarry Kos, MD  ceFAZolin (ANCEF) 1-5 GM-% Inject 50 mLs (1 g total) into the vein every 8 (eight) hours. 04/10/13   Naiping Donnelly Stager, MD  cephALEXin (KEFLEX) 500 MG capsule Take 1 capsule (500 mg total) by mouth 4 (four) times daily. 11/21/15   Leta Baptist, MD  HYDROcodone-acetaminophen (NORCO) 5-325 MG per tablet Take 1-2 tablets by mouth every 6 (six) hours as needed. 04/10/13   Tarry Kos, MD  ibuprofen (ADVIL,MOTRIN) 800 MG tablet Take 1 tablet (800 mg total) by mouth 3 (three) times daily. 04/04/13   Jennifer Piepenbrink, PA-C  sulfamethoxazole-trimethoprim (BACTRIM DS,SEPTRA DS) 800-160 MG tablet Take 1 tablet by mouth 2 (two) times daily. 11/21/15 11/28/15  Leta Baptist, MD    Family History Family History  Problem Relation Age of Onset  . Hypertension Mother   . Hypertension Father     Social History Social History  Substance Use Topics  . Smoking status: Never Smoker  . Smokeless tobacco: Never Used  . Alcohol use Yes     Comment: WEEKENDS      Allergies   Review of patient's allergies indicates no known allergies.   Review of Systems Review of Systems  Constitutional: Negative for activity change,  diaphoresis, fatigue and fever.  HENT: Negative for congestion, postnasal drip, rhinorrhea and sore throat.   Eyes: Negative for visual disturbance.  Respiratory: Negative for cough, chest tightness, shortness of breath and wheezing.   Cardiovascular: Negative for chest pain and palpitations.  Gastrointestinal: Negative for abdominal pain, constipation, diarrhea, nausea and vomiting.  Genitourinary: Negative for dysuria, frequency and urgency.  Musculoskeletal: Positive for joint swelling (swelling around the right ankle). Negative for back pain.  Skin: Positive for color change (redness of the skin around the right ankle with tracking up the leg). Negative for wound.    Neurological: Negative for dizziness, syncope, weakness, numbness and headaches.  Hematological: Does not bruise/bleed easily.     Physical Exam Updated Vital Signs BP 139/86 (BP Location: Right Arm)   Pulse 69   Temp 98.4 F (36.9 C) (Oral)   Resp 16   Ht  (1.778 m)   Wt 185 lb (83.9 kg)   SpO2 98%   BMI 26.54 kg/m   Physical Exam  Constitutional: He is oriented to person, place, and time. He appears well-developed and well-nourished. No distress.  HENT:  Head: Normocephalic and atraumatic.  Right Ear: External ear normal.  Left Ear: External ear normal.  Mouth/Throat: Oropharynx is clear and moist. No oropharyngeal exudate.  Eyes: EOM are normal. Pupils are equal, round, and reactive to light.  Neck: Normal range of motion. Neck supple.  Cardiovascular: Normal rate, regular rhythm, normal heart sounds and intact distal pulses.   No murmur heard. Pulmonary/Chest: Effort normal. No respiratory distress. He has no wheezes. He has no rales.  Abdominal: Soft. He exhibits no distension. There is no tenderness.  Musculoskeletal: He exhibits no edema.       Right ankle: He exhibits decreased range of motion (secondary to skin swelling, joint not irritable and pain not worsened with range of motion) and swelling. He exhibits no ecchymosis, no deformity, no laceration and normal pulse. No tenderness. Achilles tendon exhibits no pain.  Feet:  Right Foot:  Skin Integrity: Positive for erythema (over the dorsum of the foot) and warmth (over the dorsum of the foot). Negative for ulcer, blister or skin breakdown.  Neurological: He is alert and oriented to person, place, and time.  Skin: Skin is warm and dry. No rash noted. He is not diaphoretic. There is erythema.     Vitals reviewed.    ED Treatments / Results  Labs (all labs ordered are listed, but only abnormal results are displayed) Labs Reviewed  CBC WITH DIFFERENTIAL/PLATELET - Abnormal; Notable for the following:        Result Value   Monocytes Absolute 1.1 (*)    All other components within normal limits  BASIC METABOLIC PANEL    EKG  EKG Interpretation None       Radiology Dg Ankle Complete Right  Result Date: 11/21/2015 CLINICAL DATA:  Right ankle swelling EXAM: RIGHT ANKLE - COMPLETE 3+ VIEW COMPARISON:  None. FINDINGS: No fracture or dislocation is seen. The ankle mortise is intact. The base of the fifth metatarsal is unremarkable. Moderate soft tissue swelling, most prominent medially. IMPRESSION: No fracture or dislocation is seen. Moderate soft tissue swelling, most prominent medially. Electronically Signed   By: Charline Bills M.D.   On: 11/21/2015 16:53   Procedures Procedures (including critical care time)  Medications Ordered in ED Medications - No data to display   Initial Impression / Assessment and Plan / ED Course  I have reviewed the triage vital signs and  the nursing notes.  Pertinent labs & imaging results that were available during my care of the patient were reviewed by me and considered in my medical decision making (see chart for details).  Clinical Course  Patient was seen and evaluated in stable condition. Bedside ultrasound did not reveal any discrete pockets of fluid - no abscess seen. Ankle was not your irritable. Patient afebrile with normal white blood cell count. Physical examination and presentation appear most consistent with cellulitis. Patient does not have risk factors for DVT and clinically this does not appear as a DVT. No calf pain or tenderness. Patient will be discharged on Bactrim and Keflex. He was instructed to follow-up in 2 days outpatient with internal medicine. He was told if he could not arrange follow-up he could come back here or recheck to make sure that his symptoms are improving. He was provided with orthopedics information in case he felt that the pain seemed to go more into his ankle. She expressed understanding and agreement with plan of  care.  Final Clinical Impressions(s) / ED Diagnoses   Final diagnoses:  Cellulitis of right lower extremity    New Prescriptions New Prescriptions   CEPHALEXIN (KEFLEX) 500 MG CAPSULE    Take 1 capsule (500 mg total) by mouth 4 (four) times daily.   SULFAMETHOXAZOLE-TRIMETHOPRIM (BACTRIM DS,SEPTRA DS) 800-160 MG TABLET    Take 1 tablet by mouth 2 (two) times daily.     Leta Baptist, MD 11/21/15 [phone]

## 2016-01-03 ENCOUNTER — Encounter (HOSPITAL_COMMUNITY): Payer: Self-pay | Admitting: Vascular Surgery

## 2016-01-03 ENCOUNTER — Emergency Department (HOSPITAL_COMMUNITY)
Admission: EM | Admit: 2016-01-03 | Discharge: 2016-01-03 | Disposition: A | Discharge status: Home / Self Care | Payer: Self-pay | Attending: Emergency Medicine | Admitting: Emergency Medicine

## 2016-01-03 DIAGNOSIS — L03116 Cellulitis of left lower limb: Secondary | ICD-10-CM | POA: Insufficient documentation

## 2016-01-03 MED ORDER — SULFAMETHOXAZOLE-TRIMETHOPRIM 800-160 MG PO TABS
1.0000 | ORAL_TABLET | Freq: Once | ORAL | Status: AC
  Administered 2016-01-03: 1 via ORAL
  Filled 2016-01-03: qty 1

## 2016-01-03 MED ORDER — CEPHALEXIN 250 MG PO CAPS
500.0000 mg | ORAL_CAPSULE | Freq: Once | ORAL | Status: AC
  Administered 2016-01-03: 500 mg via ORAL
  Filled 2016-01-03: qty 2

## 2016-01-03 MED ORDER — SULFAMETHOXAZOLE-TRIMETHOPRIM 800-160 MG PO TABS: 1.0000 | ORAL_TABLET | Freq: Two times a day (BID) | ORAL | 0 refills | Status: AC

## 2016-01-03 MED ORDER — CEPHALEXIN 500 MG PO CAPS: 500.0000 mg | ORAL_CAPSULE | Freq: Four times a day (QID) | ORAL | 0 refills | Status: DC

## 2016-01-03 NOTE — Discharge Instructions (Signed)
IMPORTANT PATIENT INSTRUCTIONS:  You have been scheduled for an Outpatient Vascular Study at [facility].   ° °If tomorrow is a Saturday or Sunday, please go to the [facility] Registration Desk at 8 am tomorrow morning and tell them you are there for a vascular study.  If tomorrow is a weekday (Monday-Friday), please go to  Admitting Department at 8 am and tell them you are there for a vascular study. °

## 2016-01-03 NOTE — ED Provider Notes (Signed)
[facility] DEPT Provider Note   CSN: [id number] Arrival date & time: 01/03/16  2000     History   Chief Complaint Chief Complaint  Patient presents with  . Foot Pain    HPI Devon Richardson is a 50 y.o. male presenting with left foot/leg pain for the past 3 days. Significant other states he's been having some aching without swelling or redness for about one week. However now has redness, a little warmth, and swelling. Painful to ambulate. Has been trying to elevate but this also hurts. No weakness or numbness. Is exactly the same as when he presented to the ER in July and had an infection in his right foot. Does not room or any type of injury. The most painful area is in his medial ankle. No fevers. Is not a diabetic. No CP/dyspnea.  HPI  Past Medical History:  Diagnosis Date  . Abscess of second toe 03/2013   RIGHT FOOT    Patient Active Problem List   Diagnosis Date Noted  . Toe infection 04/08/2013    Past Surgical History:  Procedure Laterality Date  . DEBRIDEMENT TOE Right 04/08/2013   2ND TOE      . I&D EXTREMITY Right 04/08/2013   Procedure: IRRIGATION AND DEBRIDEMENT RIGHT 2ND TOE;  Surgeon: Cheral Almas, MD;  Location: [facility] OR;  Service: Orthopedics;  Laterality: Right;       Home Medications    Prior to Admission medications   Medication Sig Start Date End Date Taking? Authorizing Provider  cephALEXin (KEFLEX) 500 MG capsule Take 1 capsule (500 mg total) by mouth 4 (four) times daily. 01/03/16   Pricilla Loveless, MD  sulfamethoxazole-trimethoprim (BACTRIM DS,SEPTRA DS) 800-160 MG tablet Take 1 tablet by mouth 2 (two) times daily. 01/03/16 01/10/16  Pricilla Loveless, MD    Family History Family History  Problem Relation Age of Onset  . Hypertension Mother   . Hypertension Father     Social History Social History  Substance Use Topics  . Smoking status: Never Smoker  . Smokeless tobacco: Never Used  . Alcohol use Yes     Comment: WEEKENDS       Allergies   Review of patient's allergies indicates no known allergies.   Review of Systems Review of Systems  Constitutional: Negative for fever.  Respiratory: Negative for shortness of breath.   Cardiovascular: Positive for leg swelling. Negative for chest pain.  Musculoskeletal: Positive for joint swelling.  Skin: Positive for color change. Negative for wound.  Neurological: Negative for weakness and numbness.  All other systems reviewed and are negative.    Physical Exam Updated Vital Signs BP 134/91 (BP Location: Left Arm)   Pulse 75   Temp 98.2 F (36.8 C) (Oral)   Resp 20   Ht 5\' 10"  (1.778 m)   Wt 182 lb (82.6 kg)   SpO2 100%   BMI 26.11 kg/m   Physical Exam  Constitutional: He is oriented to person, place, and time. He appears well-developed and well-nourished.  HENT:  Head: Normocephalic and atraumatic.  Right Ear: External ear normal.  Left Ear: External ear normal.  Nose: Nose normal.  Eyes: Right eye exhibits no discharge. Left eye exhibits no discharge.  Neck: Neck supple.  Cardiovascular: Normal rate, regular rhythm and intact distal pulses.   Pulses:      Dorsalis pedis pulses are 2+ on the right side, and 2+ on the left side.  Pulmonary/Chest: Effort normal.  Abdominal: He exhibits no distension.  Musculoskeletal: He  exhibits edema and tenderness.       Left ankle: He exhibits swelling. He exhibits normal range of motion. Tenderness.       Left lower leg: He exhibits no tenderness.       Legs:      Left foot: There is tenderness and swelling.  Neurological: He is alert and oriented to person, place, and time.  Skin: Skin is warm and dry.  Nursing note and vitals reviewed.    ED Treatments / Results  Labs (all labs ordered are listed, but only abnormal results are displayed) Labs Reviewed - No data to display  EKG  EKG Interpretation None       Radiology No results found.  Procedures Procedures (including critical care  time)  ULTRASOUND LIMITED SOFT TISSUE/ MUSCULOSKELETAL: Left ankle/foot Indication: skin infection, rule out fluid collection Linear probe used to evaluate area of interest in two planes. Findings:  Edema/inflammation without abscess Performed by: Dr Criss AlvineGoldston Images saved electronically  Medications Ordered in ED Medications - No data to display   Initial Impression / Assessment and Plan / ED Course  I have reviewed the triage vital signs and the nursing notes.  Pertinent labs & imaging results that were available during my care of the patient were reviewed by me and considered in my medical decision making (see chart for details).  Clinical Course    Patient presents with recurrent cellulitis, now on the left foot instead of right. No chest pain or shortness of breath. No signs or symptoms of sepsis. Afebrile. Will treat with Keflex and Bactrim. Given the unilateral swelling, will also arrange for a DVT ultrasound tomorrow as this is not available at this time of night. No signs or symptoms of PE. He is neurovascularly intact. Discussed return precautions, I have highly recommended he actually follow-up with the PCP.  Final Clinical Impressions(s) / ED Diagnoses   Final diagnoses:  Cellulitis of left foot    New Prescriptions New Prescriptions   CEPHALEXIN (KEFLEX) 500 MG CAPSULE    Take 1 capsule (500 mg total) by mouth 4 (four) times daily.   SULFAMETHOXAZOLE-TRIMETHOPRIM (BACTRIM DS,SEPTRA DS) 800-160 MG TABLET    Take 1 tablet by mouth 2 (two) times daily.     Pricilla LovelessScott Bishop Vanderwerf, MD 01/03/16 2256

## 2016-01-03 NOTE — ED Triage Notes (Signed)
Pt reports to the ED for eval of left foot pain and swelling. Pt reports he had a similar problem in the right foot and was dx with cellulitis an given oral abx and pain medications and d/c'ed. States this feels/appears the exact same. Swelling and erythema noted to foot. Denies any fevers or chills.

## 2016-01-04 ENCOUNTER — Ambulatory Visit (HOSPITAL_COMMUNITY): Payer: Self-pay | Attending: Emergency Medicine

## 2017-06-08 IMAGING — DX DG ANKLE COMPLETE 3+V*R*
3 series · 3 of 3 positions shown · non-contrast
Comparison: None.

CLINICAL DATA: Right ankle swelling

EXAM:
RIGHT ANKLE - COMPLETE 3+ VIEW

[ankle ap]
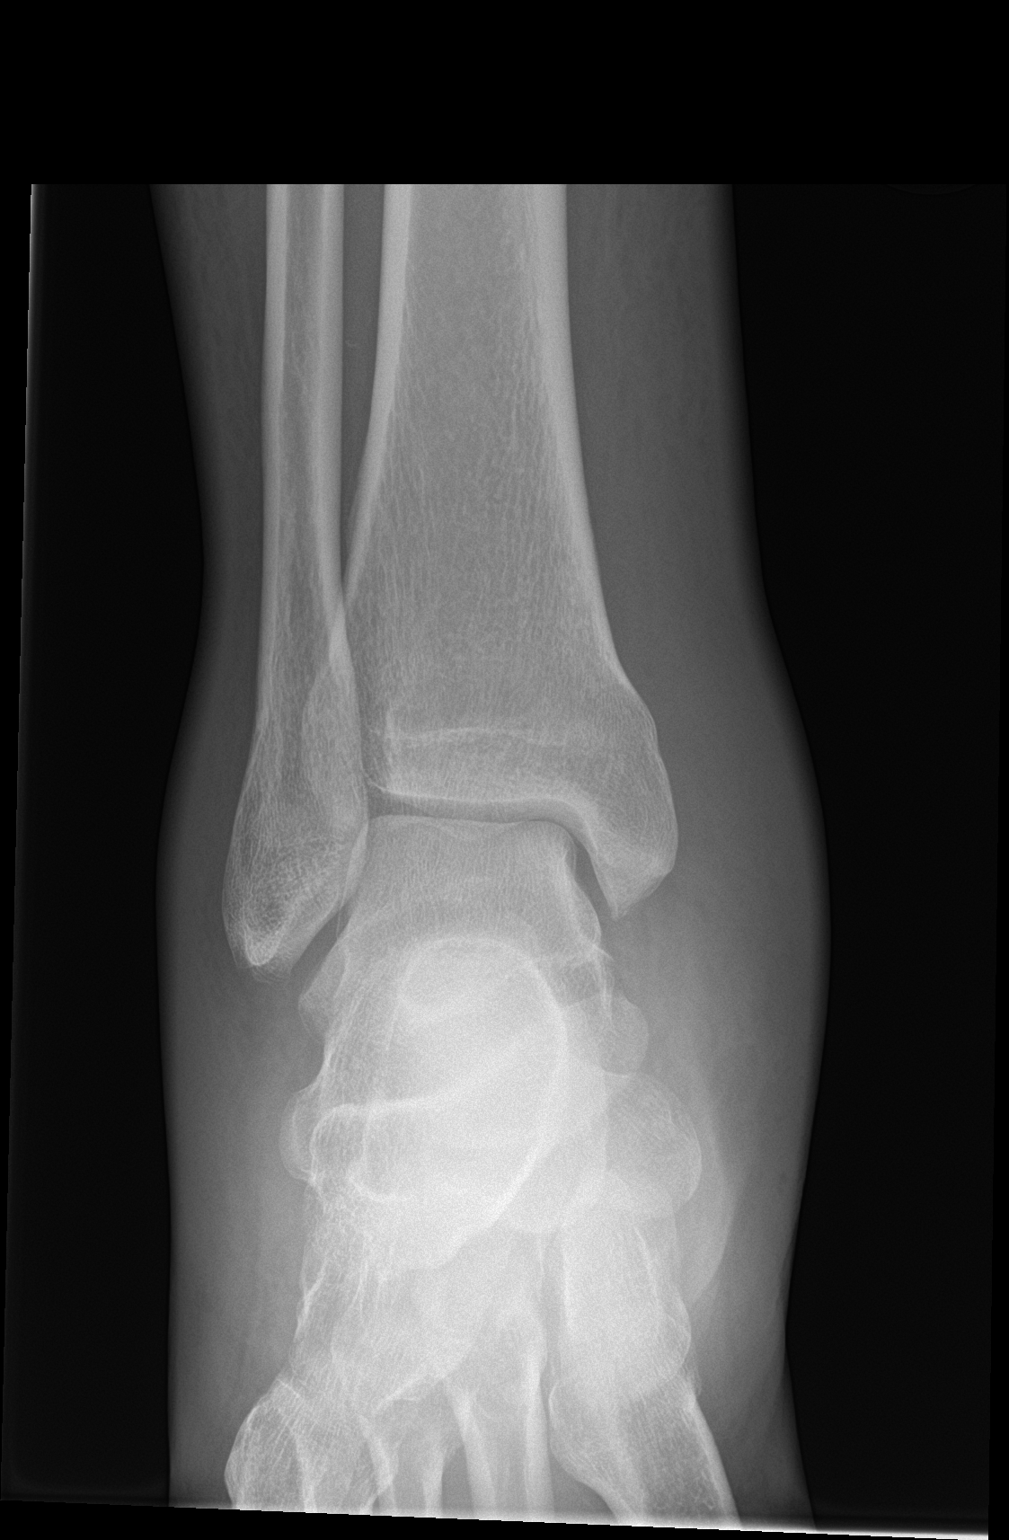

[ankle obl]
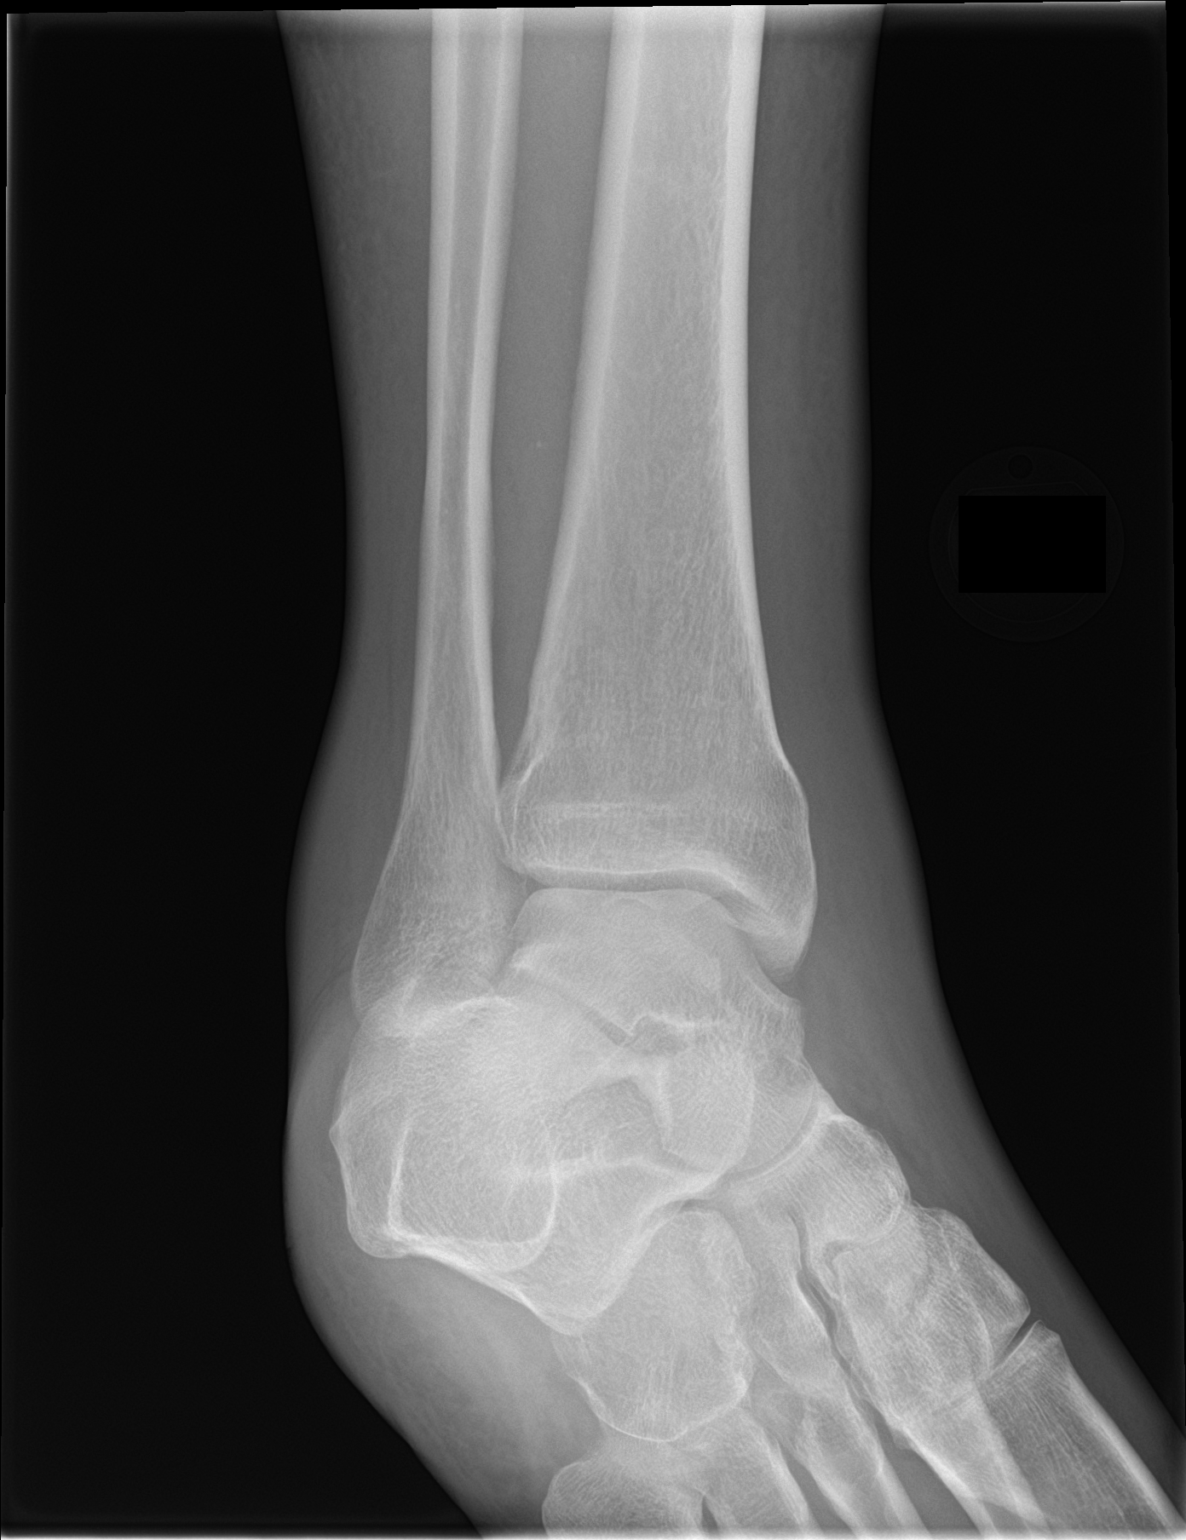

[ankle lat]
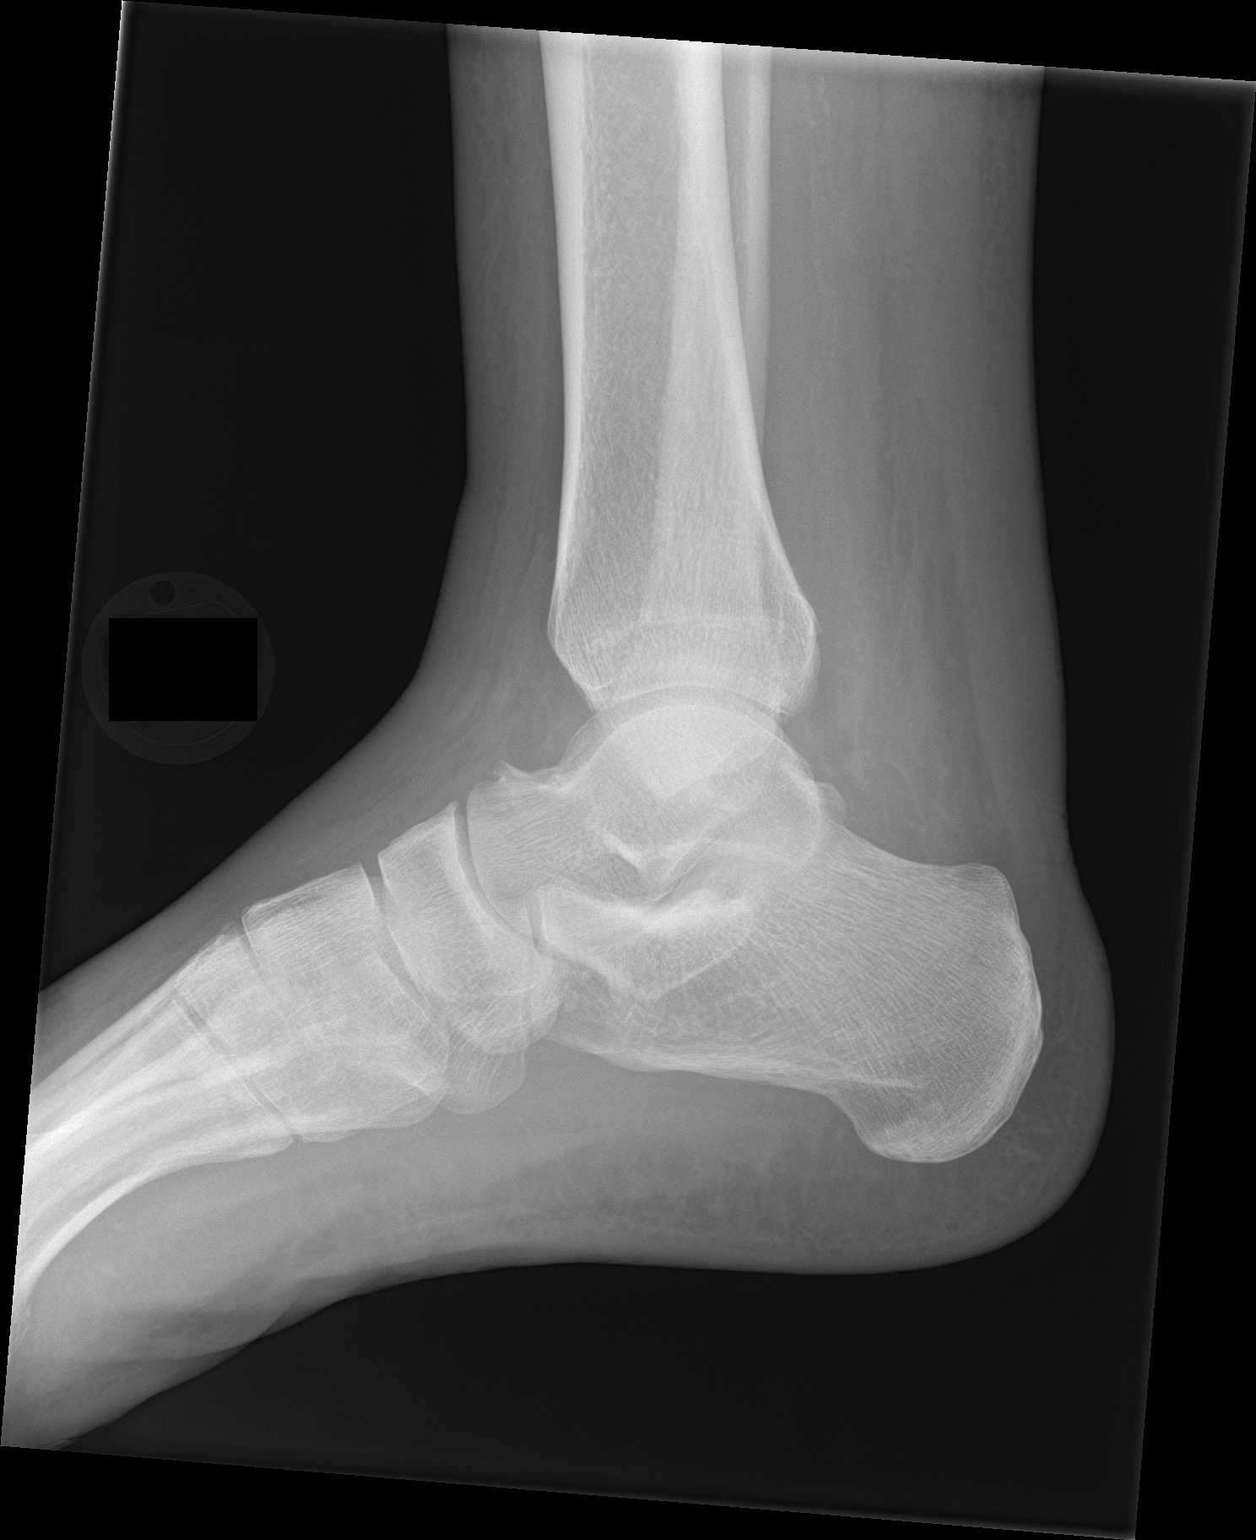

[3 of 3 positions shown; findings below may reference images not displayed]

FINDINGS: No fracture or dislocation is seen.

The ankle mortise is intact.

The base of the fifth metatarsal is unremarkable.

Moderate soft tissue swelling, most prominent medially.
IMPRESSION: No fracture or dislocation is seen.

Moderate soft tissue swelling, most prominent medially.

## 2018-07-26 ENCOUNTER — Encounter (HOSPITAL_COMMUNITY): Payer: Self-pay | Admitting: Emergency Medicine

## 2018-07-26 ENCOUNTER — Other Ambulatory Visit: Payer: Self-pay

## 2018-07-26 ENCOUNTER — Emergency Department (HOSPITAL_COMMUNITY)
Admission: EM | Admit: 2018-07-26 | Discharge: 2018-07-27 | Disposition: A | Discharge status: Home / Self Care | Payer: Self-pay | Attending: Emergency Medicine | Admitting: Emergency Medicine

## 2018-07-26 DIAGNOSIS — L03116 Cellulitis of left lower limb: Secondary | ICD-10-CM | POA: Insufficient documentation

## 2018-07-26 LAB — BASIC METABOLIC PANEL
ANION GAP: 10 (ref 5–15)
BUN: 10 mg/dL (ref 6–20)
CO2: 23 mmol/L (ref 22–32)
Calcium: 9.1 mg/dL (ref 8.9–10.3)
Chloride: 104 mmol/L (ref 98–111)
Creatinine, Ser: 0.99 mg/dL (ref 0.61–1.24)
GFR calc Af Amer: >60 mL/min (ref >60)
GFR calc non Af Amer: >60 mL/min (ref >60)
Glucose, Bld: 95 mg/dL (ref 70–99)
POTASSIUM: 3.7 mmol/L (ref 3.5–5.1)
SODIUM: 137 mmol/L (ref 135–145)

## 2018-07-26 LAB — CBC WITH DIFFERENTIAL/PLATELET
ABS IMMATURE GRANULOCYTES: 0.01 10*3/uL (ref 0.00–0.07)
BASOS PCT: 0 %
Basophils Absolute: 0 10*3/uL (ref 0.0–0.1)
Eosinophils Absolute: 0.1 10*3/uL (ref 0.0–0.5)
Eosinophils Relative: 1 %
HCT: 40.1 % (ref 39.0–52.0)
Hemoglobin: 12.2 g/dL — ABNORMAL LOW (ref 13.0–17.0)
IMMATURE GRANULOCYTES: 0 %
LYMPHS PCT: 19 %
Lymphs Abs: 1.2 10*3/uL (ref 0.7–4.0)
MCH: 27.7 pg (ref 26.0–34.0)
MCHC: 30.4 g/dL (ref 30.0–36.0)
MCV: 91.1 fL (ref 80.0–100.0)
Monocytes Absolute: 0.6 10*3/uL (ref 0.1–1.0)
Monocytes Relative: 10 %
NEUTROS PCT: 70 %
Neutro Abs: 4.4 10*3/uL (ref 1.7–7.7)
Platelets: 237 10*3/uL (ref 150–400)
RBC: 4.4 MIL/uL (ref 4.22–5.81)
RDW: 12.9 % (ref 11.5–15.5)
WBC: 6.3 10*3/uL (ref 4.0–10.5)
nRBC: 0 % (ref 0.0–0.2)

## 2018-07-26 LAB — URIC ACID: URIC ACID, SERUM: 4.8 mg/dL (ref 3.7–8.6)
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
## 2018-07-27 MED ORDER — SULFAMETHOXAZOLE-TRIMETHOPRIM 800-160 MG PO TABS
1.0000 | ORAL_TABLET | Freq: Once | ORAL | Status: AC
Start: 2018-07-27 — End: 2018-07-27
  Administered 2018-07-27: 1 via ORAL
  Filled 2018-07-27: qty 1

## 2018-07-27 MED ORDER — SULFAMETHOXAZOLE-TRIMETHOPRIM 800-160 MG PO TABS: 1.0000 | ORAL_TABLET | Freq: Two times a day (BID) | ORAL | 0 refills | Status: AC

## 2018-07-27 MED ORDER — CEPHALEXIN 500 MG PO CAPS: 500.0000 mg | ORAL_CAPSULE | Freq: Four times a day (QID) | ORAL | 0 refills | Status: AC

## 2018-07-27 MED ORDER — CEPHALEXIN 500 MG PO CAPS
500.0000 mg | ORAL_CAPSULE | Freq: Once | ORAL | Status: AC
  Administered 2018-07-27: 500 mg via ORAL
  Filled 2018-07-27: qty 1

## 2018-07-27 NOTE — Discharge Instructions (Signed)
Take the prescribed medication as directed.  Keep leg elevated to help with swelling. Return to the ED for new or worsening symptoms-- worsening swelling, redness, high fever, weeping of the legs, abscess formation, sores, etc.
